# Patient Record
Sex: Male | Born: 1950 | Race: Black or African American | Hispanic: No | Marital: Married | Smoking: Never smoker
Health system: Southern US, Community
[De-identification: ages and names within clinical notes are randomized; demographics above are authoritative.]

## PROBLEM LIST (undated history)

## (undated) DIAGNOSIS — F419 Anxiety disorder, unspecified: Secondary | ICD-10-CM

## (undated) DIAGNOSIS — T7840XA Allergy, unspecified, initial encounter: Secondary | ICD-10-CM

## (undated) DIAGNOSIS — D649 Anemia, unspecified: Secondary | ICD-10-CM

## (undated) DIAGNOSIS — E119 Type 2 diabetes mellitus without complications: Secondary | ICD-10-CM

## (undated) DIAGNOSIS — K219 Gastro-esophageal reflux disease without esophagitis: Secondary | ICD-10-CM

## (undated) DIAGNOSIS — G709 Myoneural disorder, unspecified: Secondary | ICD-10-CM

## (undated) DIAGNOSIS — IMO0002 Reserved for concepts with insufficient information to code with codable children: Secondary | ICD-10-CM

## (undated) HISTORY — DX: Type 2 diabetes mellitus without complications: E11.9

## (undated) HISTORY — DX: Anxiety disorder, unspecified: F41.9

## (undated) HISTORY — DX: Gastro-esophageal reflux disease without esophagitis: K21.9

## (undated) HISTORY — DX: Allergy, unspecified, initial encounter: T78.40XA

## (undated) HISTORY — PX: COLONOSCOPY: SHX174

## (undated) HISTORY — DX: Reserved for concepts with insufficient information to code with codable children: IMO0002

## (undated) HISTORY — DX: Anemia, unspecified: D64.9

## (undated) HISTORY — DX: Myoneural disorder, unspecified: G70.9

---

## 1998-12-25 ENCOUNTER — Emergency Department (HOSPITAL_COMMUNITY): Admission: EM | Admit: 1998-12-25 | Discharge: 1998-12-25 | Payer: Self-pay | Admitting: Emergency Medicine

## 2010-04-25 ENCOUNTER — Encounter: Admission: RE | Admit: 2010-04-25 | Discharge: 2010-04-25 | Payer: Self-pay | Admitting: *Deleted

## 2014-07-24 ENCOUNTER — Other Ambulatory Visit: Payer: Self-pay | Admitting: Family Medicine

## 2014-07-24 DIAGNOSIS — R17 Unspecified jaundice: Secondary | ICD-10-CM

## 2014-07-24 DIAGNOSIS — R945 Abnormal results of liver function studies: Secondary | ICD-10-CM

## 2014-07-24 DIAGNOSIS — R7989 Other specified abnormal findings of blood chemistry: Secondary | ICD-10-CM

## 2014-07-28 ENCOUNTER — Ambulatory Visit
Admission: RE | Admit: 2014-07-28 | Discharge: 2014-07-28 | Disposition: A | Discharge status: Home / Self Care | Payer: Medicare Other | Source: Ambulatory Visit | Attending: Family Medicine | Admitting: Family Medicine

## 2014-07-28 DIAGNOSIS — R17 Unspecified jaundice: Secondary | ICD-10-CM

## 2014-07-28 DIAGNOSIS — R945 Abnormal results of liver function studies: Secondary | ICD-10-CM

## 2014-07-28 DIAGNOSIS — R7989 Other specified abnormal findings of blood chemistry: Secondary | ICD-10-CM

## 2015-04-23 ENCOUNTER — Encounter (HOSPITAL_COMMUNITY): Payer: Self-pay | Admitting: *Deleted

## 2015-04-23 ENCOUNTER — Other Ambulatory Visit: Payer: Self-pay

## 2015-04-23 ENCOUNTER — Emergency Department (HOSPITAL_COMMUNITY)
Admission: EM | Admit: 2015-04-23 | Discharge: 2015-04-24 | Disposition: A | Discharge status: Home / Self Care | Payer: Medicare Other | Attending: Emergency Medicine | Admitting: Emergency Medicine

## 2015-04-23 DIAGNOSIS — Z791 Long term (current) use of non-steroidal anti-inflammatories (NSAID): Secondary | ICD-10-CM | POA: Insufficient documentation

## 2015-04-23 DIAGNOSIS — R002 Palpitations: Secondary | ICD-10-CM

## 2015-04-23 DIAGNOSIS — Z87828 Personal history of other (healed) physical injury and trauma: Secondary | ICD-10-CM | POA: Insufficient documentation

## 2015-04-23 LAB — CBC
HCT: 34.8 % — ABNORMAL LOW (ref 39.0–52.0)
HEMOGLOBIN: 11.7 g/dL — AB (ref 13.0–17.0)
MCH: 25.3 pg — AB (ref 26.0–34.0)
MCHC: 33.6 g/dL (ref 30.0–36.0)
MCV: 75.3 fL — AB (ref 78.0–100.0)
PLATELETS: 270 10*3/uL (ref 150–400)
RBC: 4.62 MIL/uL (ref 4.22–5.81)
RDW: 15.1 % (ref 11.5–15.5)
WBC: 6.9 10*3/uL (ref 4.0–10.5)

## 2015-04-23 LAB — I-STAT TROPONIN, ED: TROPONIN I, POC: 0 ng/mL (ref 0.00–0.08)

## 2015-04-23 LAB — BASIC METABOLIC PANEL
Anion gap: 6 (ref 5–15)
BUN: 13 mg/dL (ref 6–20)
CALCIUM: 8.9 mg/dL (ref 8.9–10.3)
CO2: 26 mmol/L (ref 22–32)
CREATININE: 1.03 mg/dL (ref 0.61–1.24)
Chloride: 106 mmol/L (ref 101–111)
Glucose, Bld: 131 mg/dL — ABNORMAL HIGH (ref 65–99)
Potassium: 4.9 mmol/L (ref 3.5–5.1)
Sodium: 138 mmol/L (ref 135–145)

## 2015-04-23 NOTE — ED Notes (Signed)
Pt reports palpitations shortly after taking his Mobic today around 1615. Pt states the palpations feel "lighter" then when they initially started. Pt denies CP, shortness of breath.

## 2015-04-23 NOTE — ED Notes (Signed)
Pt c/o palpitations starting today after taking Mobic; denies shortness of breath, diaphoresis, or NV. Pt is taking Mobic for a muscle injury from working out. Reports 15 minutes after taking medication, felt palpitations. Pt is physically active and bradycardia at baseline. Pt denies palpitations at present.

## 2015-04-24 NOTE — Discharge Instructions (Signed)
Cardiac Event Monitoring A cardiac event monitor is a small recording device used to help detect abnormal heart rhythms (arrhythmias). The monitor is used to record heart rhythm when noticeable symptoms such as the following occur:  Fast heartbeats (palpitations), such as heart racing or fluttering.  Dizziness.  Fainting or light-headedness.  Unexplained weakness. The monitor is wired to two electrodes placed on your chest. Electrodes are flat, sticky disks that attach to your skin. The monitor can be worn for up to 30 days. You will wear the monitor at all times, except when bathing.  HOW TO USE YOUR CARDIAC EVENT MONITOR A technician will prepare your chest for the electrode placement. The technician will show you how to place the electrodes, how to work the monitor, and how to replace the batteries. Take time to practice using the monitor before you leave the office. Make sure you understand how to send the information from the monitor to your health care provider. This requires a telephone with a landline, not a cell phone. You need to:  Wear your monitor at all times, except when you are in water:  Do not get the monitor wet.  Take the monitor off when bathing. Do not swim or use a hot tub with it on.  Keep your skin clean. Do not put body lotion or moisturizer on your chest.  Change the electrodes daily or any time they stop sticking to your skin. You might need to use tape to keep them on.  It is possible that your skin under the electrodes could become irritated. To keep this from happening, try to put the electrodes in slightly different places on your chest. However, they must remain in the area under your left breast and in the upper right section of your chest.  Make sure the monitor is safely clipped to your clothing or in a location close to your body that your health care provider recommends.  Press the button to record when you feel symptoms of heart trouble, such as  dizziness, weakness, light-headedness, palpitations, thumping, shortness of breath, unexplained weakness, or a fluttering or racing heart. The monitor is always on and records what happened slightly before you pressed the button, so do not worry about being too late to get good information.  Keep a diary of your activities, such as walking, doing chores, and taking medicine. It is especially important to note what you were doing when you pushed the button to record your symptoms. This will help your health care provider determine what might be contributing to your symptoms. The information stored in your monitor will be reviewed by your health care provider alongside your diary entries.  Send the recorded information as recommended by your health care provider. It is important to understand that it will take some time for your health care provider to process the results.  Change the batteries as recommended by your health care provider. SEEK IMMEDIATE MEDICAL CARE IF:   You have chest pain.  You have extreme difficulty breathing or shortness of breath.  You develop a very fast heartbeat that persists.  You develop dizziness that does not go away.  You faint or constantly feel you are about to faint. Document Released: 07/25/2008 Document Revised: 03/02/2014 Document Reviewed: 04/14/2013 [facility] Patient Information 2015 [place], [place]. This information is not intended to replace advice given to you by your health care provider. Make sure you discuss any questions you have with your health care provider. Palpitations A palpitation is the feeling  that your heartbeat is irregular or is faster than normal. It may feel like your heart is fluttering or skipping a beat. Palpitations are usually not a serious problem. However, in some cases, you may need further medical evaluation. CAUSES  Palpitations can be caused by:  Smoking.  Caffeine or other stimulants, such as diet pills or energy  drinks.  Alcohol.  Stress and anxiety.  Strenuous physical activity.  Fatigue.  Certain medicines.  Heart disease, especially if you have a history of irregular heart rhythms (arrhythmias), such as atrial fibrillation, atrial flutter, or supraventricular tachycardia.  An improperly working pacemaker or defibrillator. DIAGNOSIS  To find the cause of your palpitations, your health care provider will take your medical history and perform a physical exam. Your health care provider may also have you take a test called an ambulatory electrocardiogram (ECG). An ECG records your heartbeat patterns over a 24-hour period. You may also have other tests, such as:  Transthoracic echocardiogram (TTE). During echocardiography, sound waves are used to evaluate how blood flows through your heart.  Transesophageal echocardiogram (TEE).  Cardiac monitoring. This allows your health care provider to monitor your heart rate and rhythm in real time.  Holter monitor. This is a portable device that records your heartbeat and can help diagnose heart arrhythmias. It allows your health care provider to track your heart activity for several days, if needed.  Stress tests by exercise or by giving medicine that makes the heart beat faster. TREATMENT  Treatment of palpitations depends on the cause of your symptoms and can vary greatly. Most cases of palpitations do not require any treatment other than time, relaxation, and monitoring your symptoms. Other causes, such as atrial fibrillation, atrial flutter, or supraventricular tachycardia, usually require further treatment. HOME CARE INSTRUCTIONS   Avoid:  Caffeinated coffee, tea, soft drinks, diet pills, and energy drinks.  Chocolate.  Alcohol.  Stop smoking if you smoke.  Reduce your stress and anxiety. Things that can help you relax include:  A method of controlling things in your body, such as your heartbeats, with your mind  (biofeedback).  Yoga.  Meditation.  Physical activity such as swimming, jogging, or walking.  Get plenty of rest and sleep. SEEK MEDICAL CARE IF:   You continue to have a fast or irregular heartbeat beyond 24 hours.  Your palpitations occur more often. SEEK IMMEDIATE MEDICAL CARE IF:  You have chest pain or shortness of breath.  You have a severe headache.  You feel dizzy or you faint. MAKE SURE YOU:  Understand these instructions.  Will watch your condition.  Will get help right away if you are not doing well or get worse. Document Released: 10/13/2000 Document Revised: 10/21/2013 Document Reviewed: 12/15/2011 [facility] Patient Information 2015 [place], [place]. This information is not intended to replace advice given to you by your health care provider. Make sure you discuss any questions you have with your health care provider.

## 2015-04-24 NOTE — ED Provider Notes (Signed)
CSN: [id number]     Arrival date & time 04/23/15  1951 History   First MD Initiated Contact with Patient 04/23/15 2309     Chief Complaint  Patient presents with  . Palpitations     (Consider location/radiation/quality/duration/timing/severity/associated sxs/prior Treatment) HPI Comments: Dustin Marshall is a 64 y/o male with no significant PMH who presents to the ED with a one day history of palpitations.  States that Dustin Marshall was prescribed meloxicam yesterday by his orthopedist for a shoulder injury from working out; took meloxicam today at 4:15, and 15 minutes later began experiencing palpitations, prompting patient to come to ED.  States heart was not beating fast and had no chest pain, but could feel his heart "beating loudly and forcefully" in his chest.  Palpitations lasted approx. 1 hour and have since subsided.  Patient is a very physically active runner and has never experienced palpitations before; states Dustin Marshall wanted to make sure "nothing dangerous" was going on. Denies any other drug use, fever, sweats, chills, nausea, vomiting, or pain radiation.  Patient is a 64 y.o. male presenting with palpitations. The history is provided by the patient.  Palpitations   History reviewed. No pertinent past medical history. History reviewed. No pertinent past surgical history. History reviewed. No pertinent family history. History  Substance Use Topics  . Smoking status: Never Smoker   . Smokeless tobacco: Never Used  . Alcohol Use: Yes    Review of Systems  Cardiovascular: Positive for palpitations.  All other systems reviewed and are negative.     Allergies  Mobic  Home Medications   Prior to Admission medications   Medication Sig Start Date End Date Taking? Authorizing Provider  famotidine (PEPCID) 20 MG tablet Take 20 mg by mouth once.   Yes Historical Provider, MD  fluticasone (FLONASE) 50 MCG/ACT nasal spray Place 2 sprays into both nostrils daily as needed for allergies.   Yes  Historical Provider, MD  meloxicam (MOBIC) 15 MG tablet Take 15 mg by mouth daily.    Historical Provider, MD   BP 104/74 mmHg  Pulse 46  Temp(Src) 97.9 F (36.6 C) (Oral)  Resp 14  SpO2 100% Physical Exam  Constitutional: Dustin Marshall is oriented to person, place, and time. Dustin Marshall appears well-developed.  HENT:  Head: Normocephalic and atraumatic.  Eyes: Conjunctivae and EOM are normal. Pupils are equal, round, and reactive to light.  Neck: Normal range of motion. Neck supple.  Cardiovascular: Normal rate, regular rhythm and normal heart sounds.   Pulmonary/Chest: Effort normal and breath sounds normal. No respiratory distress. Dustin Marshall has no wheezes.  Abdominal: Soft. Bowel sounds are normal. Dustin Marshall exhibits no distension. There is no tenderness. There is no rebound and no guarding.  Neurological: Dustin Marshall is alert and oriented to person, place, and time.  Skin: Skin is warm.  Nursing note and vitals reviewed.   ED Course  Procedures (including critical care time) Labs Review Labs Reviewed  CBC - Abnormal; Notable for the following:    Hemoglobin 11.7 (*)    HCT 34.8 (*)    MCV 75.3 (*)    MCH 25.3 (*)    All other components within normal limits  BASIC METABOLIC PANEL - Abnormal; Notable for the following:    Glucose, Bld 131 (*)    All other components within normal limits  I-STAT TROPOININ, ED    Imaging Review No results found.   EKG Interpretation   Date/Time:  Friday April 23 2015 20:06:30 EDT Ventricular Rate:  48 PR Interval:  128 QRS Duration: 88 QT Interval:  436 QTC Calculation: 389 R Axis:   75 Text Interpretation:  Sinus bradycardia Otherwise normal ECG ED PHYSICIAN  INTERPRETATION AVAILABLE IN [facility] Confirmed by TEST, Record  ([id number]) on 04/24/2015 7:34:36 AM      MDM   Final diagnoses:  Palpitations    Pt comes in with palpitations that lasted for an hour. Pt was asymptomatic, and has no palpitations currently. Advised pcp f/u.     Varney Biles,  MD 04/24/15 [phone]

## 2015-06-16 IMAGING — US US ABDOMEN COMPLETE
1 series · 14 of 25 positions shown · non-contrast
Comparison: None.

CLINICAL DATA: 63-year-old male with abnormal liver function
studies and elevated bilirubin. Initial encounter.

EXAM:
ULTRASOUND ABDOMEN COMPLETE

[Series 1: us abdomen complete · 0.30mm/px · 14 of 79 slices shown]
[im 1/79]
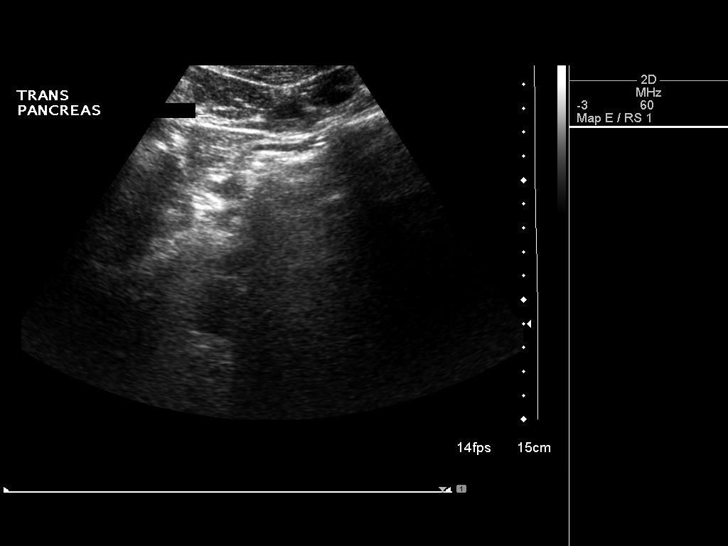
[im 7/79]
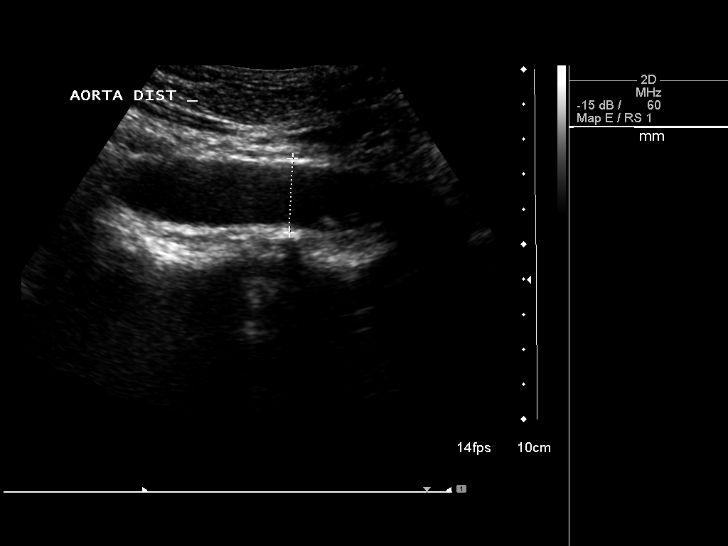
[im 14/79]
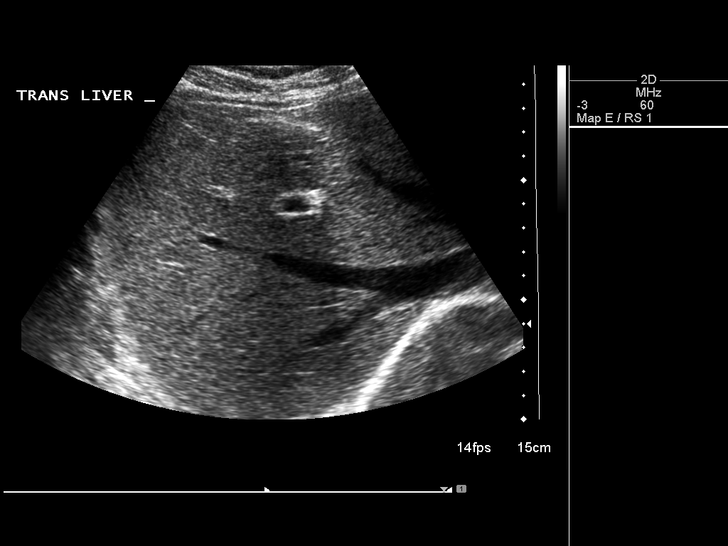
[im 20/79]
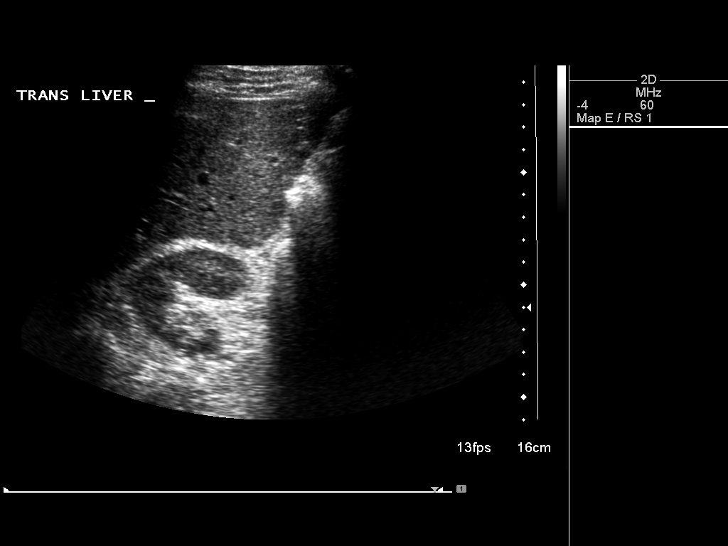
[im 27/79]
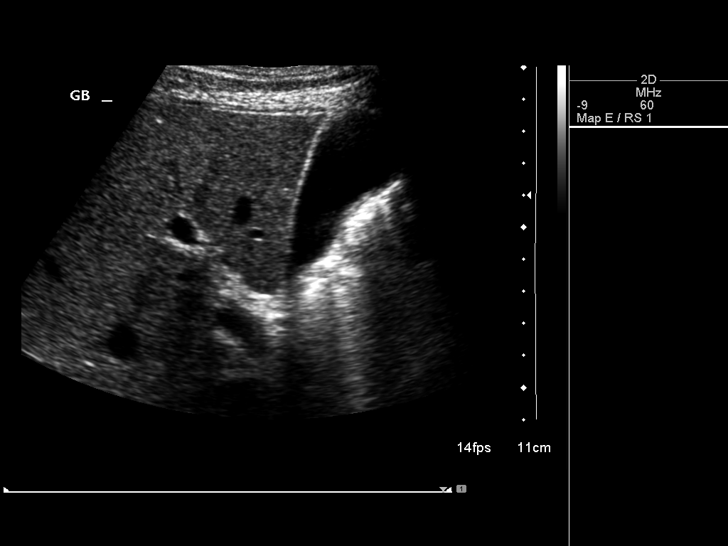
[im 30/79]
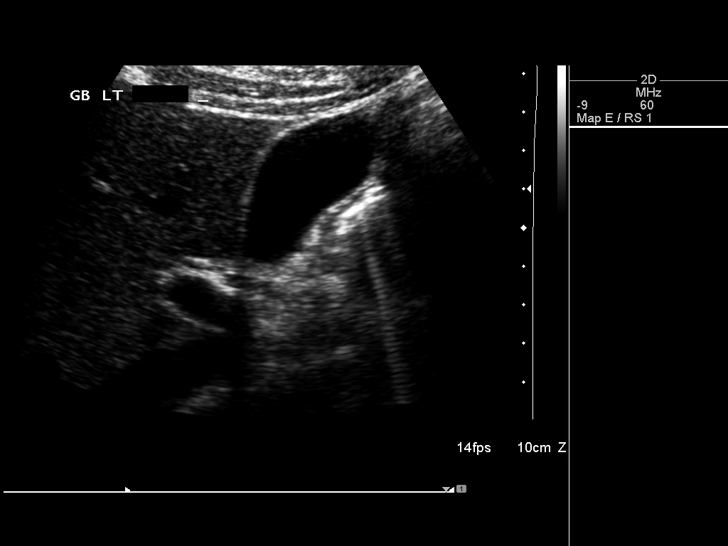
[im 36/79]
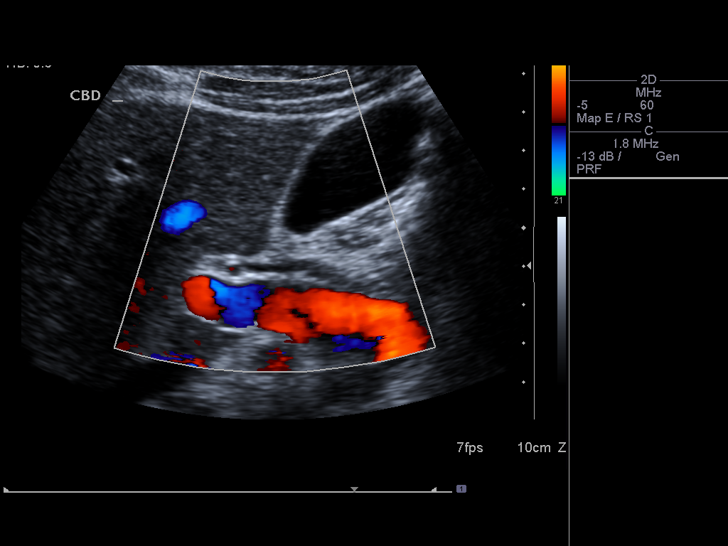
[im 43/79]
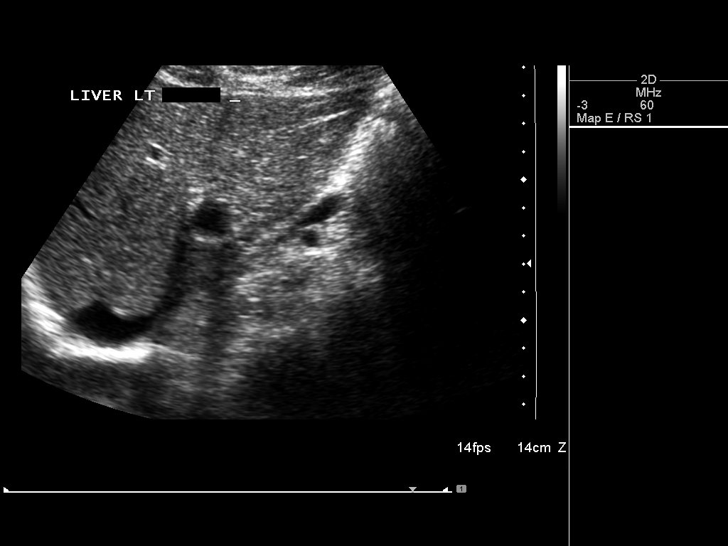
[im 49/79]
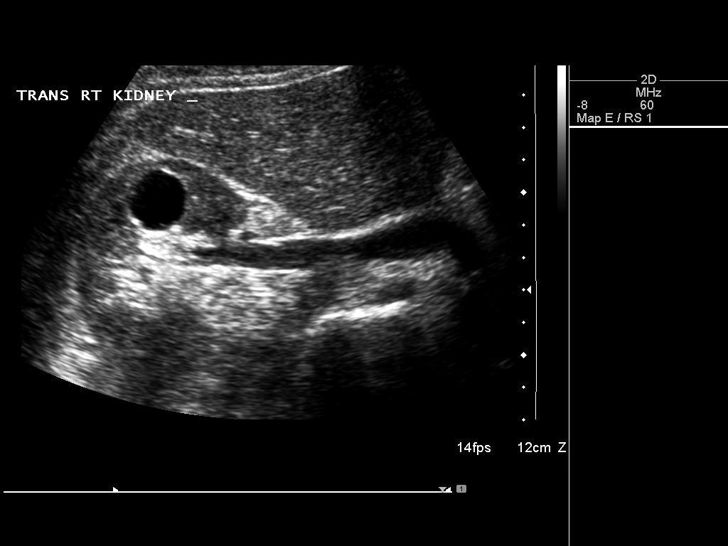
[im 53/79]
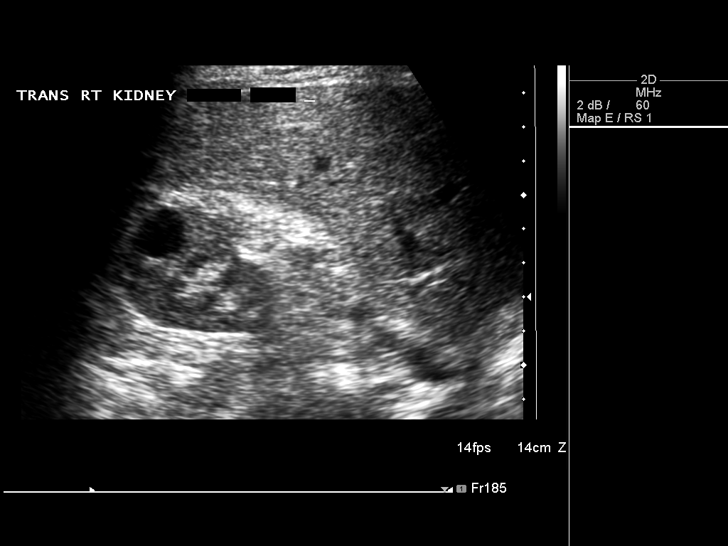
[im 59/79]
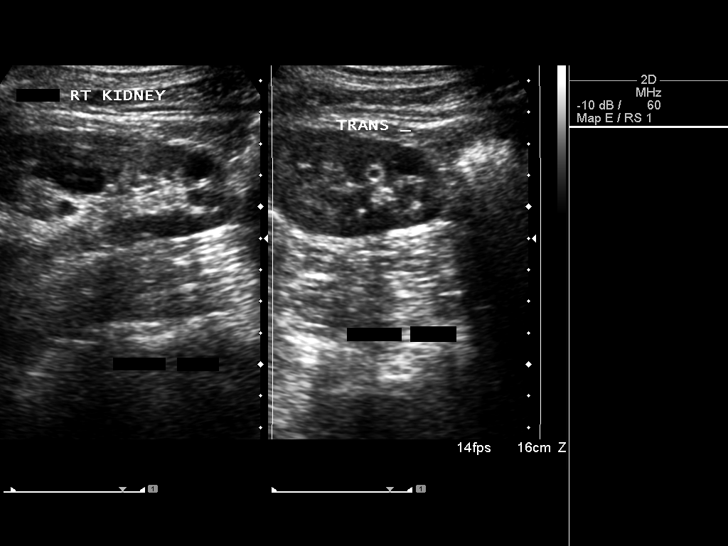
[im 66/79]
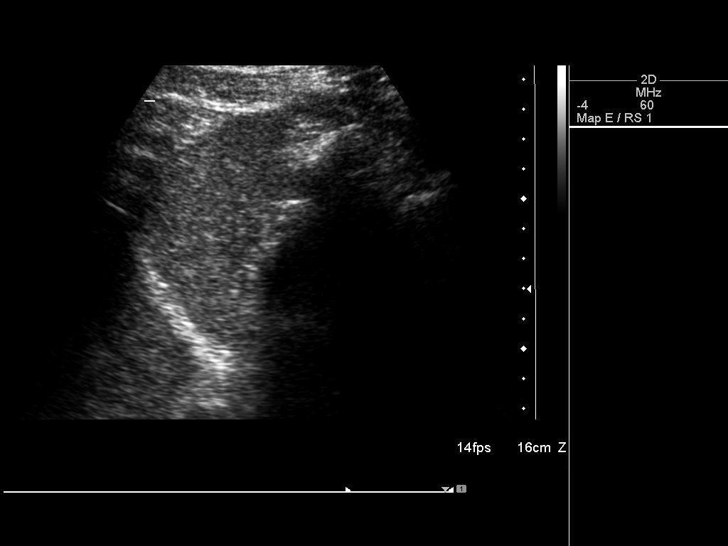
[im 72/79]
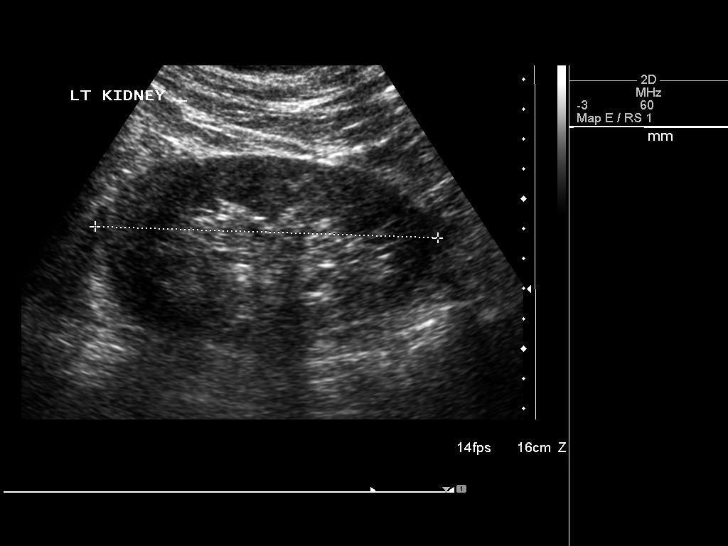
[im 79/79]
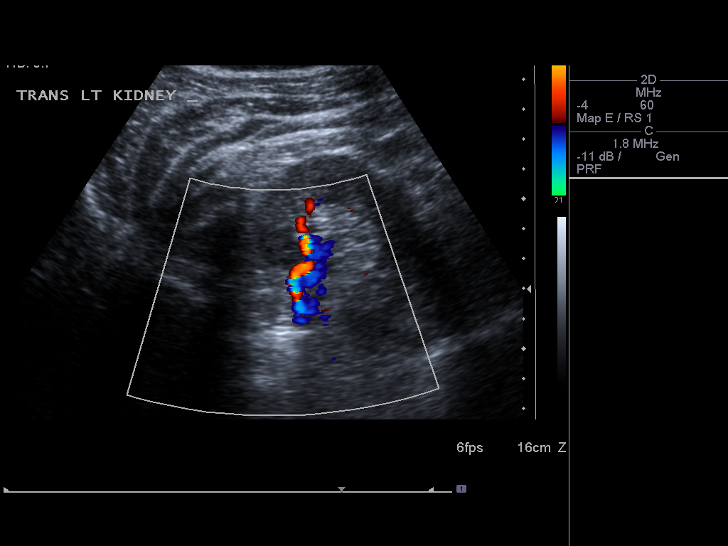

[14 of 25 positions shown; findings below may reference images not displayed]

FINDINGS: Gallbladder:

No gallstones or wall thickening visualized. No sonographic Murphy
sign noted.

Common bile duct:

Diameter: 2.5 mm. Distal aspect not visualized secondary to bowel
gas.

Liver:

No focal lesion identified. Within normal limits in parenchymal
echogenicity.

IVC:

No abnormality visualized.

Pancreas:

Not visualized secondary to bowel gas.

Spleen:

Size and appearance within normal limits.

Right Kidney:

Length: 13.0 cm.. No hydronephrosis. Three renal cysts measuring up
to 1.7 cm, 1.9 cm and less than 1 cm.

Left Kidney:

Length: 11.5 cm.. Echogenicity within normal limits. No mass or
hydronephrosis visualized.

Abdominal aorta:

No aneurysm visualized.

Other findings:

None.
IMPRESSION: Pancreas and distal common bile duct not visualized secondary to
bowel gas.

Three right renal cysts largest measuring up to 1.9 cm.

Otherwise negative exam as noted.

## 2015-12-21 ENCOUNTER — Telehealth: Payer: Self-pay | Admitting: Hematology and Oncology

## 2015-12-21 NOTE — Telephone Encounter (Signed)
Called pt left vm in ref to np appt. °

## 2015-12-22 ENCOUNTER — Telehealth: Payer: Self-pay | Admitting: Hematology and Oncology

## 2015-12-22 NOTE — Telephone Encounter (Signed)
Pt is aware of np appt on 12/28/15@1 :30 Referring Dr. Penelope Coop Dx-Thalassemia

## 2015-12-28 ENCOUNTER — Ambulatory Visit (HOSPITAL_BASED_OUTPATIENT_CLINIC_OR_DEPARTMENT_OTHER): Payer: Medicare Other | Admitting: Hematology and Oncology

## 2015-12-28 ENCOUNTER — Encounter: Payer: Self-pay | Admitting: Hematology and Oncology

## 2015-12-28 VITALS — BP 145/78 | HR 43 | Temp 97.5°F | Resp 19 | Ht 74.0 in | Wt 191.0 lb

## 2015-12-28 DIAGNOSIS — D509 Iron deficiency anemia, unspecified: Secondary | ICD-10-CM | POA: Diagnosis not present

## 2015-12-28 NOTE — Progress Notes (Signed)
[facility] CONSULT NOTE  Patient Care Team: Aura Dials, MD as PCP - General (Family Medicine) Wonda Horner, MD as Consulting Physician (Gastroenterology)  CHIEF COMPLAINTS/PURPOSE OF CONSULTATION:  Chronic microcytic anemia, suspect thalassemia  HISTORY OF PRESENTING ILLNESS:  Dustin Marshall 65 y.o. male is here because of chronic microcytic anemia with possible diagnosis of thalassemia. The patient printed a copy of his blood work dated all the way to July 2005. Even when he was younger, he was told that he had been anemic He had CBC measurement with hemoglobin ranging from 12.2-13.3 with low MCV ranging from 76.9-7 9.6. The RBC count were within normal limits from 4.8-5.2. He never required blood transfusions. He never complained of fatigue, chest pain or shortness of breath Multiple family members including his niece and his sister were diagnosed with thalassemia.  MEDICAL HISTORY:  Past Medical History  Diagnosis Date  . Allergy   . GERD (gastroesophageal reflux disease)     SURGICAL HISTORY: Past Surgical History  Procedure Laterality Date  . Colonoscopy      SOCIAL HISTORY: Social History   Social History  . Marital Status: Single    Spouse Name: N/A  . Number of Children: N/A  . Years of Education: N/A   Occupational History  . Not on file.   Social History Main Topics  . Smoking status: Never Smoker   . Smokeless tobacco: Never Used  . Alcohol Use: Yes  . Drug Use: No  . 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   Other Topics Concern  . Not on file   Social History Narrative    FAMILY HISTORY: Family History  Problem Relation Age of Onset  . Thalassemia Sister   . Thalassemia Other   . Cancer Father     lung ca  . Cancer Brother     lung ca    ALLERGIES:  is allergic to mobic.  MEDICATIONS:  Current Outpatient Prescriptions  Medication Sig Dispense Refill   . famotidine (PEPCID) 20 MG tablet Take 20 mg by mouth once.    . fluticasone (FLONASE) 50 MCG/ACT nasal spray Place 2 sprays into both nostrils daily as needed for allergies.     No current facility-administered medications for this visit.    REVIEW OF SYSTEMS:   Constitutional: Denies fevers, chills or abnormal night sweats Eyes: Denies blurriness of vision, double vision or watery eyes Ears, nose, mouth, throat, and face: Denies mucositis or sore throat Respiratory: Denies cough, dyspnea or wheezes Cardiovascular: Denies palpitation, chest discomfort or lower extremity swelling Gastrointestinal:  Denies nausea, heartburn or change in bowel habits Skin: Denies abnormal skin rashes Lymphatics: Denies new lymphadenopathy or easy bruising Neurological:Denies numbness, tingling or new weaknesses Behavioral/Psych: Mood is stable, no new changes  All other systems were reviewed with the patient and are negative.  PHYSICAL EXAMINATION: ECOG PERFORMANCE STATUS: 0 - Asymptomatic  Filed Vitals:   12/28/15 1346  BP: 145/78  Pulse: 43  Temp: 97.5 F (36.4 C)  Resp: 19   Filed Weights   12/28/15 1346  Weight: 191 lb (86.637 kg)    GENERAL:alert, no distress and comfortable SKIN: skin color, texture, turgor are normal, no rashes or significant lesions EYES: normal, conjunctiva are pink and non-injected, sclera clear OROPHARYNX:no exudate, no erythema and lips, buccal mucosa, and tongue normal  NECK: supple, thyroid normal size, non-tender, without nodularity LYMPH:  no palpable lymphadenopathy  in the cervical, axillary or inguinal LUNGS: clear to auscultation and percussion with normal breathing effort HEART: regular rate & rhythm and no murmurs and no lower extremity edema ABDOMEN:abdomen soft, non-tender and normal bowel sounds. No palpable splenomegaly Musculoskeletal:no cyanosis of digits and no clubbing  PSYCH: alert & oriented x 3 with fluent speech NEURO: no focal  motor/sensory deficits  LABORATORY DATA:  I have reviewed the data as listed Lab Results  Component Value Date   WBC 6.9 04/23/2015   HGB 11.7* 04/23/2015   HCT 34.8* 04/23/2015   MCV 75.3* 04/23/2015   PLT 270 04/23/2015    Recent Labs  04/23/15 2024  NA 138  K 4.9  CL 106  CO2 26  GLUCOSE 131*  BUN 13  CREATININE 1.03  CALCIUM 8.9  GFRNONAA >60  GFRAA >60   ASSESSMENT & PLAN:   Microcytic anemia On review of his blood work dated back the last 12 years, 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 We discussed the risk and benefit of ordering hemoglobin electrophoresis to confirm the diagnosis. The patient has been feeling well with no symptoms of anemia whatsoever. 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 I provided patient extensive education about what it means. It does not cause reduced life span and it has no negative consequences to his health or longevity I recommend the patient to educate other family members as this is an inheritable disorder. At the end of today's discussion, we are in agreement not to pursue further workup. I have not made return appointment for the patient to come back.    All questions were answered. The patient knows to call the clinic with any problems, questions or concerns. I spent 30 minutes counseling the patient face to face. The total time spent in the appointment was 40 minutes and more than 50% was on counseling.     [facility], [place], MD 12/28/2015 2:23 PM

## 2015-12-28 NOTE — Assessment & Plan Note (Addendum)
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 We discussed the risk and benefit of ordering hemoglobin electrophoresis to confirm the diagnosis. The patient has been feeling well with no symptoms of anemia whatsoever. 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 I provided patient extensive education about what it means. It does not cause reduced life span and it has no negative consequences to his health or longevity I recommend the patient to educate other family members as this is an inheritable disorder. At the end of today's discussion, we are in agreement not to pursue further workup. I have not made return appointment for the patient to come back.

## 2017-01-15 ENCOUNTER — Encounter: Payer: Self-pay | Admitting: Neurology

## 2017-01-15 ENCOUNTER — Ambulatory Visit (INDEPENDENT_AMBULATORY_CARE_PROVIDER_SITE_OTHER): Payer: Medicare Other | Admitting: Neurology

## 2017-01-15 VITALS — BP 116/74 | HR 46 | Resp 20 | Ht 73.5 in | Wt 172.0 lb

## 2017-01-15 DIAGNOSIS — R519 Headache, unspecified: Secondary | ICD-10-CM

## 2017-01-15 DIAGNOSIS — K148 Other diseases of tongue: Secondary | ICD-10-CM

## 2017-01-15 DIAGNOSIS — H93A9 Pulsatile tinnitus, unspecified ear: Secondary | ICD-10-CM

## 2017-01-15 DIAGNOSIS — R51 Headache: Secondary | ICD-10-CM | POA: Diagnosis not present

## 2017-01-15 DIAGNOSIS — I671 Cerebral aneurysm, nonruptured: Secondary | ICD-10-CM

## 2017-01-15 DIAGNOSIS — K1379 Other lesions of oral mucosa: Secondary | ICD-10-CM

## 2017-01-15 DIAGNOSIS — H539 Unspecified visual disturbance: Secondary | ICD-10-CM

## 2017-01-15 NOTE — Progress Notes (Signed)
[facility]    Provider:  Dr Jaynee Eagles Referring Provider: Aura Dials, MD Primary Care Physician:  Cammy Copa, MD  CC:  headache  HPI:  Dustin Marshall is a 66 y.o. male here as a referral from Dr. Sheryn Bison for headache. Past medical history sciatica, allergic rhinitis, allergies. Mother died of a cerebral hemorrhage and sister was found recently to have 2 aneurysms in her brain. He has never smoked. Drinks alcohol 2+ per week. He is retired. Headache started in November. No inciting events or head trauma. He just noticed one day, nothing special and not intnse. At first they were stronger than now but they come 1-2x a day and 1-2x a night. They last 5-15 minutes. 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 At night it is worse when laying down. In the crown of the head and further back a little, sometime in the past had eye pain but recently more of an ache. Not burning or tingling. No light or sound sensitivity. No exposure to toxins. No changes in vision, he may need to have his glasses change. He has pulsatile tinnitus. A tylenol helps but not taking tylenol daily. No triggers. On average 3-4/10. His vision is worse today on exam that it was a month ago at the doctors office. No hx of headaches. Mo FHx of migraines. He snores but has lost 35 lbs. No morning or nocturnal headaches. No stress. He is retired and stress is better. Hi vision is 20/70 today which is worse than when he went to optometrist a month ago. No other focal neurologic deficits, associated symptoms, inciting events or modifiable factors.  Reviewed notes, labs and imaging from outside physicians, which showed:  Review primary care notes. Patient was last seen in early January. For daily headaches since November 2017. Headaches are mild to moderate in intensity and generally located over his frontal area on top of his head and described as persistent aching  pain. He also began to develop pain behind his right eye. No change in his vision. He saw an eye doctor last year. No precipitating causes for his headaches but they do tend to be worse at night. They do not appear to be positional. His had no pain in his neck or nasal congestion. He's had no nausea or vomiting. He does feel little bit dizzy on occasion when he goes from a lying to a sitting position. No trauma to his head. His mother died of a cerebral hemorrhage and his sister was found recently to have 2 aneurysms in her brain. Tongue and palate deviate to the right. Right frontal disc slightly indistinct compared to left.   Review of Systems: Patient complains of symptoms per HPI as well as the following symptoms: dizziness, allergies, runny nose. Pertinent negatives per HPI. All others negative.   Social History   Social History  . Marital status: Single    Spouse name: N/A  . Number of children: N/A  . Years of education: N/A   Occupational History  . Not on file.   Social History Main Topics  . Smoking status: Never Smoker  . Smokeless tobacco: Never Used  . Alcohol use Yes  . Drug use: No  . 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   Other Topics Concern  . Not on file   Social History Narrative  . No narrative  on file    Family History  Problem Relation Age of Onset  . Thalassemia Other   . Cancer Father     lung ca  . Cancer Brother     lung ca  . Thalassemia Sister   . Aneurysm Sister   . Aneurysm Mother     Past Medical History:  Diagnosis Date  . Allergy   . GERD (gastroesophageal reflux disease)     Past Surgical History:  Procedure Laterality Date  . COLONOSCOPY      Current Outpatient Prescriptions  Medication Sig Dispense Refill  . fluticasone (FLONASE) 50 MCG/ACT nasal spray Place 2 sprays into both nostrils daily as needed for allergies.     No current facility-administered  medications for this visit.     Allergies as of 01/15/2017 - Review Complete 01/15/2017  Allergen Reaction Noted  . Mobic [meloxicam] Palpitations 04/24/2015    Vitals: BP 116/74   Pulse (!) 46   Resp 20   Ht 6' 1.5" (1.867 m)   Wt 172 lb (78 kg)   BMI 22.38 kg/m  Last Weight:  Wt Readings from Last 1 Encounters:  01/15/17 172 lb (78 kg)   Last Height:   Ht Readings from Last 1 Encounters:  01/15/17 6' 1.5" (1.867 m)   Physical exam: Exam: Gen: NAD, conversant, well nourised, well groomed                     CV: RRR, no MRG. No Carotid Bruits. No peripheral edema, warm, nontender Eyes: Conjunctivae clear without exudates or hemorrhage  Neuro: Detailed Neurologic Exam  Speech:    Speech is normal; fluent and spontaneous with normal comprehension.  Cognition:    The patient is oriented to person, place, and time;     recent and remote memory intact;     language fluent;     normal attention, concentration,     fund of knowledge Cranial Nerves:     R 20/50, left 20/70, OU 20/70 corrected. The pupils are equal, round, and reactive to light. The fundi are normal and spontaneous venous pulsations are present. Visual fields are full to finger confrontation. Extraocular movements are intact. Trigeminal sensation is intact and the muscles of mastication are normal. The face is symmetric. tongue and uvula deviate to the right. Hearing intact. Voice is normal. Shoulder shrug is normal. The tongue without fasciculations.   Coordination:    Normal finger to nose and heel to shin. Normal rapid alternating movements.   Gait:    Heel-toe and tandem gait are normal.   Motor Observation:    No asymmetry, no atrophy, and no involuntary movements noted. Tone:    Normal muscle tone.    Posture:    Posture is normal. normal erect    Strength:    Strength is V/V in the upper and lower limbs.      Sensation: intact to LT     Reflex Exam:  DTR's:    Deep tendon reflexes in  the uppers are brisk and in lower extremities are trace bilaterally.   Toes:    The toes are downgoing bilaterally.   Clonus:    Clonus is absent.       Assessment/Plan:  66 year old male with new onset daily headaches after the age of 73 for 5 months. Need MRI of the brain to evaluate for any lesions or space occupying masses especially given his tongue and uvula deviation and worsening vision. We'll check labs today for  temporal arteritis but suspicion is very low for this. Also given his pulsatile tinnitus, family history of cerebral aneurysms in mother and sister need to evaluate MRA of the head for aneurysms.  MRI brain/MRA head  Discussed: To prevent or relieve headaches, try the following: Cool Compress. Lie down and place a cool compress on your head.  Avoid headache triggers. If certain foods or odors seem to have triggered your migraines in the past, avoid them. A headache diary might help you identify triggers.  Include physical activity in your daily routine. Try a daily walk or other moderate aerobic exercise.  Manage stress. Find healthy ways to cope with the stressors, such as delegating tasks on your to-do list.  Practice relaxation techniques. Try deep breathing, yoga, massage and visualization.  Eat regularly. Eating regularly scheduled meals and maintaining a healthy diet might help prevent headaches. Also, drink plenty of fluids.  Follow a regular sleep schedule. Sleep deprivation might contribute to headaches Consider biofeedback. With this mind-body technique, you learn to control certain bodily functions - such as muscle tension, heart rate and blood pressure - to prevent headaches or reduce headache pain.    Proceed to emergency room if you experience new or worsening symptoms or symptoms do not resolve, if you have new neurologic symptoms or if headache is severe, or for any concerning symptom.   Cc: Dr. Rowe Robert, [facility] [address] [place], [place] [address]  Phone [phone] Fax [phone]

## 2017-01-15 NOTE — Patient Instructions (Signed)
Remember to drink plenty of fluid, eat healthy meals and do not skip any meals. Try to eat protein with a every meal and eat a healthy snack such as fruit or nuts in between meals. Try to keep a regular sleep-wake schedule and try to exercise daily, particularly in the form of walking, 20-30 minutes a day, if you can.   As far as your medications are concerned, I would like to suggest: Tylenol as needed. No more than 3-4x a week.  As far as diagnostic testing: MRI brain and MRA head  I would like to see you back in 4-6 weeks, sooner if we need to. Please call us with any interim questions, concerns, problems, updates or refill requests.   Our phone number is [phone]. We also have an after hours call service for urgent matters and there is a physician on-call for urgent questions. For any emergencies you know to call 911 or go to the nearest emergency room   Tension Headache A tension headache is a feeling of pain, pressure, or aching that is often felt over the front and sides of the head. The pain can be dull, or it can feel tight (constricting). Tension headaches are not normally associated with nausea or vomiting, and they do not get worse with physical activity. Tension headaches can last from 30 minutes to several days. This is the most common type of headache. CAUSES The exact cause of this condition is not known. Tension headaches often begin after stress, anxiety, or depression. Other triggers may include:  Alcohol.  Too much caffeine, or caffeine withdrawal.  Respiratory infections, such as colds, flu, or sinus infections.  Dental problems or teeth clenching.  Fatigue.  Holding your head and neck in the same position for a long period of time, such as while using a computer.  Smoking. SYMPTOMS Symptoms of this condition include:  A feeling of pressure around the head.  Dull, aching head pain.  Pain felt over the front and sides of the head.  Tenderness in the  muscles of the head, neck, and shoulders. DIAGNOSIS This condition may be diagnosed based on your symptoms and a physical exam. Tests may be done, such as a CT scan or an MRI of your head. These tests may be done if your symptoms are severe or unusual. TREATMENT This condition may be treated with lifestyle changes and medicines to help relieve symptoms. HOME CARE INSTRUCTIONS Managing Pain   Take over-the-counter and prescription medicines only as told by your health care provider.  Lie down in a dark, quiet room when you have a headache.  If directed, apply ice to the head and neck area:  Put ice in a plastic bag.  Place a towel between your skin and the bag.  Leave the ice on for 20 minutes, 2-3 times per day.  Use a heating pad or a hot shower to apply heat to the head and neck area as told by your health care provider. Eating and Drinking   Eat meals on a regular schedule.  Limit alcohol use.  Decrease your caffeine intake, or stop using caffeine. General Instructions   Keep all follow-up visits as told by your health care provider. This is important.  Keep a headache journal to help find out what may trigger your headaches. For example, write down:  What you eat and drink.  How much sleep you get.  Any change to your diet or medicines.  Try massage or other relaxation techniques.  Limit  stress.  Sit up straight, and avoid tensing your muscles.  Do not use tobacco products, including cigarettes, chewing tobacco, or e-cigarettes. If you need help quitting, ask your health care provider.  Exercise regularly as told by your health care provider.  Get 7-9 hours of sleep, or the amount recommended by your health care provider. SEEK MEDICAL CARE IF:  Your symptoms are not helped by medicine.  You have a headache that is different from what you normally experience.  You have nausea or you vomit.  You have a fever. SEEK IMMEDIATE MEDICAL CARE IF:  Your  headache becomes severe.  You have repeated vomiting.  You have a stiff neck.  You have a loss of vision.  You have problems with speech.  You have pain in your eye or ear.  You have muscular weakness or loss of muscle control.  You lose your balance or you have trouble walking.  You feel faint or you pass out.  You have confusion. This information is not intended to replace advice given to you by your health care provider. Make sure you discuss any questions you have with your health care provider. Document Released: 10/16/2005 Document Revised: 07/07/2015 Document Reviewed: 02/08/2015 Elsevier Interactive Patient Education  2017 [employer].

## 2017-01-16 LAB — BASIC METABOLIC PANEL
BUN / CREAT RATIO: 18 (ref 10–24)
BUN: 21 mg/dL (ref 8–27)
CO2: 21 mmol/L (ref 18–29)
CREATININE: 1.15 mg/dL (ref 0.76–1.27)
Calcium: 9.8 mg/dL (ref 8.6–10.2)
Chloride: 101 mmol/L (ref 96–106)
GFR calc Af Amer: 76 mL/min/{1.73_m2} (ref >59)
GFR, EST NON AFRICAN AMERICAN: 65 mL/min/{1.73_m2} (ref >59)
Glucose: 91 mg/dL (ref 65–99)
POTASSIUM: 4.8 mmol/L (ref 3.5–5.2)
SODIUM: 140 mmol/L (ref 134–144)

## 2017-01-16 LAB — SEDIMENTATION RATE: SED RATE: 32 mm/h — AB (ref 0–30)

## 2017-01-16 LAB — C-REACTIVE PROTEIN: CRP: 0.7 mg/L (ref 0.0–4.9)

## 2017-01-17 ENCOUNTER — Telehealth: Payer: Self-pay | Admitting: *Deleted

## 2017-01-17 NOTE — Telephone Encounter (Signed)
Per Dr Jaynee Eagles, spoke with patient and informed him his lab results are unremarkable. He verbalized understanding., had no questions.

## 2017-01-27 ENCOUNTER — Ambulatory Visit
Admission: RE | Admit: 2017-01-27 | Discharge: 2017-01-27 | Disposition: A | Discharge status: Home / Self Care | Payer: Medicare Other | Source: Ambulatory Visit | Attending: Neurology | Admitting: Neurology

## 2017-01-27 DIAGNOSIS — H93A9 Pulsatile tinnitus, unspecified ear: Secondary | ICD-10-CM

## 2017-01-27 DIAGNOSIS — K1379 Other lesions of oral mucosa: Secondary | ICD-10-CM

## 2017-01-27 DIAGNOSIS — H539 Unspecified visual disturbance: Secondary | ICD-10-CM | POA: Diagnosis not present

## 2017-01-27 DIAGNOSIS — K148 Other diseases of tongue: Secondary | ICD-10-CM

## 2017-01-27 DIAGNOSIS — R519 Headache, unspecified: Secondary | ICD-10-CM

## 2017-01-27 DIAGNOSIS — R51 Headache: Principal | ICD-10-CM

## 2017-01-27 DIAGNOSIS — I671 Cerebral aneurysm, nonruptured: Secondary | ICD-10-CM

## 2017-01-27 MED ORDER — GADOBENATE DIMEGLUMINE 529 MG/ML IV SOLN
16.0000 mL | Freq: Once | INTRAVENOUS | Status: AC | PRN
  Administered 2017-01-27: 16 mL via INTRAVENOUS

## 2017-01-31 ENCOUNTER — Telehealth: Payer: Self-pay

## 2017-01-31 NOTE — Telephone Encounter (Signed)
-----   Message from Melvenia Beam, MD sent at 01/31/2017 11:12 AM EDT ----- MRI normal for age

## 2017-01-31 NOTE — Telephone Encounter (Signed)
-----   Message from Melvenia Beam, MD sent at 01/31/2017 11:13 AM EDT ----- MRA normal

## 2017-01-31 NOTE — Telephone Encounter (Signed)
Called pt w/ MRI/MRA results. May call back w/ any questions/concerns.

## 2017-02-20 ENCOUNTER — Encounter (INDEPENDENT_AMBULATORY_CARE_PROVIDER_SITE_OTHER): Payer: Self-pay

## 2017-02-20 ENCOUNTER — Ambulatory Visit (INDEPENDENT_AMBULATORY_CARE_PROVIDER_SITE_OTHER): Payer: Medicare Other | Admitting: Neurology

## 2017-02-20 ENCOUNTER — Encounter: Payer: Self-pay | Admitting: Neurology

## 2017-02-20 VITALS — BP 124/76 | HR 44 | Ht 73.5 in | Wt 175.2 lb

## 2017-02-20 DIAGNOSIS — R51 Headache: Secondary | ICD-10-CM | POA: Diagnosis not present

## 2017-02-20 DIAGNOSIS — R519 Headache, unspecified: Secondary | ICD-10-CM

## 2017-02-20 NOTE — Progress Notes (Signed)
[facility]    Provider:  Dr Jaynee Eagles Referring Provider: Aura Dials, MD Primary Care Physician:  Cammy Copa, MD  CC:  headache  Interval history:  66 year old male with new onset daily headaches after the age of 18. MRI of the brain and MRA of the head were unremarkable. He returns today to discuss. Esr and crp were unremarkable as well. He is feeling better. Reviewed MRI and MRA images with patient explaining pertinent findings. His brain is very lovely in his blood vessels are widely patent. He exercises and watches his weight. He is compliant with medications. At this point we'll not prescribe medications for his headache. Did discuss headache triggers such as dehydration, certain foods to watch. Recommended keeping a headache journal. His headaches have improved.  HPI:  Dustin Marshall is a 66 y.o. male here as a referral from Dr. Sheryn Bison for headache. Past medical history sciatica, allergic rhinitis, allergies. Mother died of a cerebral hemorrhage and sister was found recently to have 2 aneurysms in her brain. He has never smoked. Drinks alcohol 2+ per week. He is retired. Headache started in November. No inciting events or head trauma. He just noticed one day, nothing special and not intnse. At first they were stronger than now but they come 1-2x a day and 1-2x a night. They last 5-15 minutes. 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 At night it is worse when laying down. In the crown of the head and further back a little, sometime in the past had eye pain but recently more of an ache. Not burning or tingling. No light or sound sensitivity. No exposure to toxins. No changes in vision, he may need to have his glasses change. He has pulsatile tinnitus. A tylenol helps but not taking tylenol daily. No triggers. On average 3-4/10. His vision is worse today on exam that it was a month ago at the doctors office. No hx of  headaches. Mo FHx of migraines. He snores but has lost 35 lbs. No morning or nocturnal headaches. No stress. He is retired and stress is better. Hi vision is 20/70 today which is worse than when he went to optometrist a month ago. No other focal neurologic deficits, associated symptoms, inciting events or modifiable factors.  Reviewed notes, labs and imaging from outside physicians, which showed:  Review primary care notes. Patient was last seen in early January. For daily headaches since November 2017. Headaches are mild to moderate in intensity and generally located over his frontal area on top of his head and described as persistent aching pain. He also began to develop pain behind his right eye. No change in his vision. He saw an eye doctor last year. No precipitating causes for his headaches but they do tend to be worse at night. They do not appear to be positional. His had no pain in his neck or nasal congestion. He's had no nausea or vomiting. He does feel little bit dizzy on occasion when he goes from a lying to a sitting position. No trauma to his head. His mother died of a cerebral hemorrhage and his sister was found recently to have 2 aneurysms in her brain. Tongue and palate deviate to the right. Right frontal disc slightly indistinct compared to left.   Review of Systems: Patient complains of symptoms per HPI as well as the following symptoms: dizziness, allergies, runny nose. Pertinent negatives per HPI. All others negative.  Marland Kitchen  Social History   Social History  . Marital status: Single    Spouse name: N/A  . Number of children: N/A  . Years of education: N/A   Occupational History  . Not on file.   Social History Main Topics  . Smoking status: Never Smoker  . Smokeless tobacco: Never Used  . Alcohol use Yes  . Drug use: No  . 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   Other Topics Concern  . Not on  file   Social History Narrative  . No narrative on file    Family History  Problem Relation Age of Onset  . Thalassemia Other   . Cancer Father     lung ca  . Cancer Brother     lung ca  . Thalassemia Sister   . Aneurysm Sister   . Aneurysm Mother     Past Medical History:  Diagnosis Date  . Allergy   . GERD (gastroesophageal reflux disease)     Past Surgical History:  Procedure Laterality Date  . COLONOSCOPY      Current Outpatient Prescriptions  Medication Sig Dispense Refill  . fluticasone (FLONASE) 50 MCG/ACT nasal spray Place 2 sprays into both nostrils daily as needed for allergies.     No current facility-administered medications for this visit.     Allergies as of 02/20/2017 - Review Complete 01/15/2017  Allergen Reaction Noted  . Mobic [meloxicam] Palpitations 04/24/2015    Vitals: There were no vitals taken for this visit. Last Weight:  Wt Readings from Last 1 Encounters:  01/15/17 172 lb (78 kg)   Last Height:   Ht Readings from Last 1 Encounters:  01/15/17 6' 1.5" (1.867 m)     Physical exam: Exam: Gen: NAD, conversant, well nourised, well groomed                     CV: RRR, no MRG. No Carotid Bruits. No peripheral edema, warm, nontender Eyes: Conjunctivae clear without exudates or hemorrhage  Neuro: Detailed Neurologic Exam  Speech:    Speech is normal; fluent and spontaneous with normal comprehension.  Cognition:    The patient is oriented to person, place, and time;     recent and remote memory intact;     language fluent;     normal attention, concentration,     fund of knowledge Cranial Nerves:     R 20/50, left 20/70, OU 20/70 corrected. The pupils are equal, round, and reactive to light. The fundi are normal and spontaneous venous pulsations are present. Visual fields are full to finger confrontation. Extraocular movements are intact. Trigeminal sensation is intact and the muscles of mastication are normal. The face is  symmetric. tongue and uvula deviate to the right. Hearing intact. Voice is normal. Shoulder shrug is normal. The tongue without fasciculations.   Coordination:    Normal finger to nose and heel to shin. Normal rapid alternating movements.   Gait:    Heel-toe and tandem gait are normal.   Motor Observation:    No asymmetry, no atrophy, and no involuntary movements noted. Tone:    Normal muscle tone.    Posture:    Posture is normal. normal erect    Strength:    Strength is V/V in the upper and lower limbs.      Sensation: intact to LT     Reflex Exam:  DTR's:  Deep tendon reflexes in the uppers are brisk and in lower extremities are trace bilaterally.   Toes:    The toes are downgoing bilaterally.   Clonus:    Clonus is absent.       Assessment/Plan:  66 year old male with new onset daily headaches after the age of 19 for 5 months. Need MRI of the brain to evaluate for any lesions or space occupying masses especially given his tongue and uvula deviation and worsening vision. We'll check labs today for temporal arteritis but suspicion is very low for this. Also given his pulsatile tinnitus, family history of cerebral aneurysms in mother and sister need to evaluate MRA of the head for aneurysms.  MRI brain/MRA head were unremarkable  Discussed: To prevent or relieve headaches, try the following:  Cool Compress. Lie down and place a cool compress on your head.   Avoid headache triggers. If certain foods or odors seem to have triggered your migraines in the past, avoid them. A headache diary might help you identify triggers.   Include physical activity in your daily routine. Try a daily walk or other moderate aerobic exercise.   Manage stress. Find healthy ways to cope with the stressors, such as delegating tasks on your to-do list.   Practice relaxation techniques. Try deep breathing, yoga, massage and visualization.   Eat regularly. Eating regularly  scheduled meals and maintaining a healthy diet might help prevent headaches. Also, drink plenty of fluids.   Follow a regular sleep schedule. Sleep deprivation might contribute to headaches  Consider biofeedback. With this mind-body technique, you learn to control certain bodily functions - such as muscle tension, heart rate and blood pressure - to prevent headaches or reduce headache pain.    Proceed to emergency room if you experience new or worsening symptoms or symptoms do not resolve, if you have new neurologic symptoms or if headache is severe, or for any concerning symptom.   Cc: Dr. Rowe Robert, MD  [facility] [address] [place], [place] [address]  Phone [phone] Fax [phone]  A total of 25 minutes was spent face-to-face with this patient. Over half this time was spent on counseling patient on the daily headaches diagnosis and different diagnostic and therapeutic options available.

## 2018-08-28 ENCOUNTER — Telehealth: Payer: Self-pay

## 2018-08-28 NOTE — Telephone Encounter (Signed)
He called and left a message requesting appt on the answering service yesterday. Called and left message to call the office regarding appt.

## 2018-08-29 ENCOUNTER — Telehealth: Payer: Self-pay

## 2018-08-29 ENCOUNTER — Telehealth: Payer: Self-pay | Admitting: Hematology and Oncology

## 2018-08-29 NOTE — Telephone Encounter (Signed)
Scheduled appt per 10/31 sch message - pt is aware of appt date and time

## 2018-08-29 NOTE — Telephone Encounter (Signed)
He called and left a message to call him. Called back. He is requesting appt with Dr. Alvy Bimler to follow up on  appt in 2017. Shedding message sent.

## 2018-10-10 ENCOUNTER — Encounter: Payer: Self-pay | Admitting: Hematology and Oncology

## 2018-10-10 ENCOUNTER — Telehealth: Payer: Self-pay | Admitting: Hematology and Oncology

## 2018-10-10 ENCOUNTER — Inpatient Hospital Stay: Payer: Medicare Other | Attending: Hematology and Oncology | Admitting: Hematology and Oncology

## 2018-10-10 DIAGNOSIS — R51 Headache: Secondary | ICD-10-CM | POA: Diagnosis not present

## 2018-10-10 DIAGNOSIS — D509 Iron deficiency anemia, unspecified: Secondary | ICD-10-CM | POA: Diagnosis not present

## 2018-10-10 DIAGNOSIS — G8929 Other chronic pain: Secondary | ICD-10-CM | POA: Insufficient documentation

## 2018-10-10 DIAGNOSIS — E559 Vitamin D deficiency, unspecified: Secondary | ICD-10-CM | POA: Diagnosis not present

## 2018-10-10 DIAGNOSIS — R519 Headache, unspecified: Secondary | ICD-10-CM

## 2018-10-10 NOTE — Telephone Encounter (Signed)
Per 12/12 los, return for no new orders.

## 2018-10-10 NOTE — Assessment & Plan Note (Signed)
He has intermittent vitamin D deficiency He has some mild chronic skeletal pain.  I recommend vitamin D supplement 2000 units daily

## 2018-10-10 NOTE — Progress Notes (Signed)
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 We discussed the implication of genetic testing  Vitamin D deficiency He has intermittent vitamin D deficiency He has some mild chronic skeletal pain.  I recommend vitamin D supplement 2000 units daily  Chronic headaches He has chronic headaches and follows with neurologist I do not believe the abnormal CBC is the cause of his headaches I would defer to them for further management    No orders of the defined types were placed in this encounter.   INTERVAL HISTORY: Dustin Marshall 67 y.o. male returns for further follow-up for return visit He brought with him copies of blood test dated back to 2005  He requested this visit because he is not sure whether his recent chronic headaches and musculoskeletal pain is related to his abnormal CBC  SUMMARY OF HEMATOLOGIC HISTORY:  Dustin Marshall was seen here because of chronic microcytic anemia with possible diagnosis of thalassemia. The patient printed a copy of his blood work dated all the way to July 2005. Even when he was younger, he was told that he had been anemic He had CBC measurement with hemoglobin ranging from 12.2-13.3 with low MCV ranging from 76.9-7 9.6. The RBC count were within normal limits from 4.8-5.2. He never required blood transfusions. He never complained of fatigue, chest pain or shortness of breath Multiple family members including his niece and his sister were diagnosed with thalassemia.  I have reviewed the past medical history, past surgical  history, social history and family history with the patient and they are unchanged from previous note.  ALLERGIES:  is allergic to mobic [meloxicam].  MEDICATIONS:  Current Outpatient Medications  Medication Sig Dispense Refill  . acetaminophen (TYLENOL) 325 MG tablet Take 650 mg by mouth every 6 (six) hours as needed.    . fluticasone (FLONASE) 50 MCG/ACT nasal spray Place 2 sprays into both nostrils daily as needed for allergies.     No current facility-administered medications for this visit.      REVIEW OF SYSTEMS:   Constitutional: Denies fevers, chills or night sweats Eyes: Denies blurriness of vision Ears, nose, mouth, throat, and face: Denies mucositis or sore throat Respiratory: Denies cough, dyspnea or wheezes Cardiovascular: Denies palpitation, chest discomfort or lower extremity swelling Gastrointestinal:  Denies nausea, heartburn or change in bowel habits Skin: Denies abnormal skin rashes Lymphatics: Denies new lymphadenopathy or easy bruising Neurological:Denies numbness, tingling or new weaknesses Behavioral/Psych: Mood is stable, no new changes  All other systems were reviewed with the patient and are negative.  PHYSICAL EXAMINATION: ECOG PERFORMANCE STATUS: 0 - Asymptomatic  Vitals:   10/10/18 0936  BP: 120/71  Pulse: (!) 50  Resp: 18  Temp: (!) 97.5 F (36.4 C)  SpO2: 100%   Filed Weights   10/10/18 0936  Weight: 186 lb 3.2 oz (84.5 kg)    GENERAL:alert, no distress and comfortable NEURO: alert & oriented x 3 with fluent speech, no focal motor/sensory deficits  LABORATORY DATA:  I have reviewed the data as listed     Component Value Date/Time   NA 140 01/15/2017 4944  K 4.8 01/15/2017 0808   CL 101 01/15/2017 0808   CO2 21 01/15/2017 0808   GLUCOSE 91 01/15/2017 0808   GLUCOSE 131 (H) 04/23/2015 2024   BUN 21 01/15/2017 0808   CREATININE 1.15 01/15/2017 0808   CALCIUM 9.8 01/15/2017 0808   GFRNONAA 65 01/15/2017 0808   GFRAA 76  01/15/2017 0808    No results found for: SPEP, UPEP  Lab Results  Component Value Date   WBC 6.9 04/23/2015   HGB 11.7 (L) 04/23/2015   HCT 34.8 (L) 04/23/2015   MCV 75.3 (L) 04/23/2015   PLT 270 04/23/2015      Chemistry      Component Value Date/Time   NA 140 01/15/2017 0808   K 4.8 01/15/2017 0808   CL 101 01/15/2017 0808   CO2 21 01/15/2017 0808   BUN 21 01/15/2017 0808   CREATININE 1.15 01/15/2017 0808      Component Value Date/Time   CALCIUM 9.8 01/15/2017 0808     I spent 15 minutes counseling the patient face to face. The total time spent in the appointment was 20 minutes and more than 50% was on counseling.   All questions were answered. The patient knows to call the clinic with any problems, questions or concerns. No barriers to learning was detected.    Heath Lark, MD 12/12/201912:28 PM

## 2018-10-10 NOTE — Assessment & Plan Note (Signed)
He has chronic headaches and follows with neurologist I do not believe the abnormal CBC is the cause of his headaches I would defer to them for further management

## 2018-10-10 NOTE — Assessment & Plan Note (Addendum)
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 We discussed the implication of genetic testing

## 2020-01-14 DIAGNOSIS — D649 Anemia, unspecified: Secondary | ICD-10-CM | POA: Diagnosis not present

## 2020-01-14 DIAGNOSIS — E559 Vitamin D deficiency, unspecified: Secondary | ICD-10-CM | POA: Diagnosis not present

## 2020-01-14 DIAGNOSIS — R972 Elevated prostate specific antigen [PSA]: Secondary | ICD-10-CM | POA: Diagnosis not present

## 2020-01-14 DIAGNOSIS — E782 Mixed hyperlipidemia: Secondary | ICD-10-CM | POA: Diagnosis not present

## 2020-07-06 DIAGNOSIS — E782 Mixed hyperlipidemia: Secondary | ICD-10-CM | POA: Diagnosis not present

## 2020-07-06 DIAGNOSIS — Z0000 Encounter for general adult medical examination without abnormal findings: Secondary | ICD-10-CM | POA: Diagnosis not present

## 2020-07-06 DIAGNOSIS — R739 Hyperglycemia, unspecified: Secondary | ICD-10-CM | POA: Diagnosis not present

## 2020-07-06 DIAGNOSIS — D6489 Other specified anemias: Secondary | ICD-10-CM | POA: Diagnosis not present

## 2020-07-06 DIAGNOSIS — R972 Elevated prostate specific antigen [PSA]: Secondary | ICD-10-CM | POA: Diagnosis not present

## 2020-07-06 DIAGNOSIS — E559 Vitamin D deficiency, unspecified: Secondary | ICD-10-CM | POA: Diagnosis not present

## 2020-07-06 DIAGNOSIS — D563 Thalassemia minor: Secondary | ICD-10-CM | POA: Diagnosis not present

## 2020-07-12 DIAGNOSIS — Z0000 Encounter for general adult medical examination without abnormal findings: Secondary | ICD-10-CM | POA: Diagnosis not present

## 2020-12-03 DIAGNOSIS — Z20822 Contact with and (suspected) exposure to covid-19: Secondary | ICD-10-CM | POA: Diagnosis not present

## 2021-07-15 DIAGNOSIS — J301 Allergic rhinitis due to pollen: Secondary | ICD-10-CM | POA: Diagnosis not present

## 2021-07-15 DIAGNOSIS — R739 Hyperglycemia, unspecified: Secondary | ICD-10-CM | POA: Diagnosis not present

## 2021-07-15 DIAGNOSIS — Z23 Encounter for immunization: Secondary | ICD-10-CM | POA: Diagnosis not present

## 2021-07-15 DIAGNOSIS — Z0000 Encounter for general adult medical examination without abnormal findings: Secondary | ICD-10-CM | POA: Diagnosis not present

## 2021-07-15 DIAGNOSIS — E559 Vitamin D deficiency, unspecified: Secondary | ICD-10-CM | POA: Diagnosis not present

## 2021-07-15 DIAGNOSIS — E782 Mixed hyperlipidemia: Secondary | ICD-10-CM | POA: Diagnosis not present

## 2021-07-15 DIAGNOSIS — D649 Anemia, unspecified: Secondary | ICD-10-CM | POA: Diagnosis not present

## 2021-09-29 DIAGNOSIS — Z0000 Encounter for general adult medical examination without abnormal findings: Secondary | ICD-10-CM | POA: Diagnosis not present

## 2022-06-02 DIAGNOSIS — R739 Hyperglycemia, unspecified: Secondary | ICD-10-CM | POA: Diagnosis not present

## 2022-06-02 DIAGNOSIS — Z1211 Encounter for screening for malignant neoplasm of colon: Secondary | ICD-10-CM | POA: Diagnosis not present

## 2022-06-02 DIAGNOSIS — G603 Idiopathic progressive neuropathy: Secondary | ICD-10-CM | POA: Diagnosis not present

## 2022-06-02 DIAGNOSIS — R7309 Other abnormal glucose: Secondary | ICD-10-CM | POA: Diagnosis not present

## 2022-06-02 DIAGNOSIS — D649 Anemia, unspecified: Secondary | ICD-10-CM | POA: Diagnosis not present

## 2022-06-02 DIAGNOSIS — E559 Vitamin D deficiency, unspecified: Secondary | ICD-10-CM | POA: Diagnosis not present

## 2022-06-06 DIAGNOSIS — K7689 Other specified diseases of liver: Secondary | ICD-10-CM | POA: Diagnosis not present

## 2022-06-08 ENCOUNTER — Other Ambulatory Visit (HOSPITAL_BASED_OUTPATIENT_CLINIC_OR_DEPARTMENT_OTHER): Payer: Self-pay | Admitting: Family Medicine

## 2022-06-08 DIAGNOSIS — R945 Abnormal results of liver function studies: Secondary | ICD-10-CM

## 2022-06-12 ENCOUNTER — Ambulatory Visit (HOSPITAL_BASED_OUTPATIENT_CLINIC_OR_DEPARTMENT_OTHER)
Admission: RE | Admit: 2022-06-12 | Discharge: 2022-06-12 | Disposition: A | Discharge status: Home / Self Care | Payer: Medicare PPO | Source: Ambulatory Visit | Attending: Family Medicine | Admitting: Family Medicine

## 2022-06-12 ENCOUNTER — Encounter (HOSPITAL_BASED_OUTPATIENT_CLINIC_OR_DEPARTMENT_OTHER): Payer: Self-pay

## 2022-06-12 DIAGNOSIS — R945 Abnormal results of liver function studies: Secondary | ICD-10-CM | POA: Insufficient documentation

## 2022-06-12 DIAGNOSIS — N281 Cyst of kidney, acquired: Secondary | ICD-10-CM | POA: Diagnosis not present

## 2022-06-26 DIAGNOSIS — G629 Polyneuropathy, unspecified: Secondary | ICD-10-CM | POA: Diagnosis not present

## 2022-07-25 DIAGNOSIS — R972 Elevated prostate specific antigen [PSA]: Secondary | ICD-10-CM | POA: Diagnosis not present

## 2022-07-25 DIAGNOSIS — G629 Polyneuropathy, unspecified: Secondary | ICD-10-CM | POA: Diagnosis not present

## 2022-07-25 DIAGNOSIS — Z1211 Encounter for screening for malignant neoplasm of colon: Secondary | ICD-10-CM | POA: Diagnosis not present

## 2022-07-25 DIAGNOSIS — E782 Mixed hyperlipidemia: Secondary | ICD-10-CM | POA: Diagnosis not present

## 2022-07-25 DIAGNOSIS — Z0000 Encounter for general adult medical examination without abnormal findings: Secondary | ICD-10-CM | POA: Diagnosis not present

## 2022-07-25 DIAGNOSIS — Z23 Encounter for immunization: Secondary | ICD-10-CM | POA: Diagnosis not present

## 2022-07-25 DIAGNOSIS — R799 Abnormal finding of blood chemistry, unspecified: Secondary | ICD-10-CM | POA: Diagnosis not present

## 2022-07-25 DIAGNOSIS — L821 Other seborrheic keratosis: Secondary | ICD-10-CM | POA: Diagnosis not present

## 2022-08-22 DIAGNOSIS — R739 Hyperglycemia, unspecified: Secondary | ICD-10-CM | POA: Diagnosis not present

## 2022-08-22 DIAGNOSIS — G629 Polyneuropathy, unspecified: Secondary | ICD-10-CM | POA: Diagnosis not present

## 2022-10-18 ENCOUNTER — Encounter: Payer: Self-pay | Admitting: Neurology

## 2022-10-18 ENCOUNTER — Ambulatory Visit: Payer: Medicare PPO | Admitting: Neurology

## 2022-10-18 VITALS — BP 136/74 | HR 48 | Ht 74.0 in | Wt 172.6 lb

## 2022-10-18 DIAGNOSIS — G608 Other hereditary and idiopathic neuropathies: Secondary | ICD-10-CM | POA: Diagnosis not present

## 2022-10-18 DIAGNOSIS — G6289 Other specified polyneuropathies: Secondary | ICD-10-CM | POA: Diagnosis not present

## 2022-10-18 NOTE — Patient Instructions (Addendum)
-May consider daily alpha lipoic acid which is an antioxidant that may reduce free radical oxidative stress associated with diabetic polyneuropathy, existing evidence suggests that alpha lipoic acid significantly reduces stabbing, lancinating and burning pain and diabetic neuropathy with its onset of action as early as 1-2 weeks. 400-'600mg'$  a day.   - Recommend treatment for diabetes since I believe you are symptomatic with Dr. Sheryn Bison possibly Metformin or per Dr. March Rummage recommendations  - Also recommend continuing the B12 daily  - Test you for a few more labs.   Peripheral Neuropathy Peripheral neuropathy is a type of nerve damage. It affects nerves that carry signals between the spinal cord and the arms, legs, and the rest of the body (peripheral nerves). It does not affect nerves in the spinal cord or brain. In peripheral neuropathy, one nerve or a group of nerves may be damaged. Peripheral neuropathy is a broad category that includes many specific nerve disorders, like diabetic neuropathy, hereditary neuropathy, and carpal tunnel syndrome. What are the causes? This condition may be caused by: Certain diseases, such as: Diabetes. This is the most common cause of peripheral neuropathy. Autoimmune diseases, such as rheumatoid arthritis and systemic lupus erythematosus. Nerve diseases that are passed from parent to child (inherited). Kidney disease. Thyroid disease. Other causes may include: Nerve injury. Pressure or stress on a nerve that lasts a long time. Lack (deficiency) of B vitamins. This can result from alcoholism, poor diet, or a restricted diet. Infections. Some medicines, such as cancer medicines (chemotherapy). Poisonous (toxic) substances, such as lead and mercury. Too little blood flowing to the legs. In some cases, the cause of this condition is not known. What are the signs or symptoms? Symptoms of this condition depend on which of your nerves is damaged. Symptoms in  the legs, hands, and arms can include: Loss of feeling (numbness) in the feet, hands, or both. Tingling in the feet, hands, or both. Burning pain. Very sensitive skin. Weakness. Not being able to move a part of the body (paralysis). Clumsiness or poor coordination. Muscle twitching. Loss of balance. Symptoms in other parts of the body can include: Not being able to control your bladder. Feeling dizzy. Sexual problems. How is this diagnosed? Diagnosing and finding the cause of peripheral neuropathy can be difficult. Your health care provider will take your medical history and do a physical exam. A neurological exam will also be done. This involves checking things that are affected by your brain, spinal cord, and nerves (nervous system). For example, your health care provider will check your reflexes, how you move, and what you can feel. You may have other tests, such as: Blood tests. Electromyogram (EMG) and nerve conduction tests. These tests check nerve function and how well the nerves are controlling the muscles. Imaging tests, such as a CT scan or MRI, to rule out other causes of your symptoms. Removing a small piece of nerve to be examined in a lab (nerve biopsy). Removing and examining a small amount of the fluid that surrounds the brain and spinal cord (lumbar puncture). How is this treated? Treatment for this condition may involve: Treating the underlying cause of the neuropathy, such as diabetes, kidney disease, or vitamin deficiencies. Stopping medicines that can cause neuropathy, such as chemotherapy. Medicine to help relieve pain. Medicines may include: Prescription or over-the-counter pain medicine. Anti-seizure medicine. Antidepressants. Pain-relieving patches that are applied to painful areas of skin. Surgery to relieve pressure on a nerve or to destroy a nerve that is causing pain.  Physical therapy to help improve movement and balance. Devices to help you move around  (assistive devices). Follow these instructions at home: Medicines Take over-the-counter and prescription medicines only as told by your health care provider. Do not take any other medicines without first asking your health care provider. Ask your health care provider if the medicine prescribed to you requires you to avoid driving or using machinery. Lifestyle  Do not use any products that contain nicotine or tobacco. These products include cigarettes, chewing tobacco, and vaping devices, such as e-cigarettes. Smoking keeps blood from reaching damaged nerves. If you need help quitting, ask your health care provider. Avoid or limit alcohol. Too much alcohol can cause a vitamin B deficiency, and vitamin B is needed for healthy nerves. Eat a healthy diet. This includes: Eating foods that are high in fiber, such as beans, whole grains, and fresh fruits and vegetables. Limiting foods that are high in fat and processed sugars, such as fried or sweet foods. General instructions  If you have diabetes, work closely with your health care provider to keep your blood sugar under control. If you have numbness in your feet: Check every day for signs of injury or infection. Watch for redness, warmth, and swelling. Wear padded socks and comfortable shoes. These help protect your feet. Develop a good support system. Living with peripheral neuropathy can be stressful. Consider talking with a mental health specialist or joining a support group. Use assistive devices and attend physical therapy as told by your health care provider. This may include using a walker or a cane. Keep all follow-up visits. This is important. Where to find more information [employer] of Neurological Disorders: [employer] Contact a health care provider if: You have new signs or symptoms of peripheral neuropathy. You are struggling emotionally from dealing with peripheral neuropathy. Your pain is not well controlled. Get  help right away if: You have an injury or infection that is not healing normally. You develop new weakness in an arm or leg. You have fallen or do so frequently. Summary Peripheral neuropathy is when the nerves in the arms or legs are damaged, resulting in numbness, weakness, or pain. There are many causes of peripheral neuropathy, including diabetes, pinched nerves, vitamin deficiencies, autoimmune disease, and hereditary conditions. Diagnosing and finding the cause of peripheral neuropathy can be difficult. Your health care provider will take your medical history, do a physical exam, and do tests, including blood tests and nerve function tests. Treatment involves treating the underlying cause of the neuropathy and taking medicines to help control pain. Physical therapy and assistive devices may also help. This information is not intended to replace advice given to you by your health care provider. Make sure you discuss any questions you have with your health care provider. Document Revised: 06/21/2021 Document Reviewed: 06/21/2021 Elsevier Patient Education  [place].

## 2022-10-18 NOTE — Progress Notes (Signed)
[facility]    Provider:  Dr Jaynee Eagles Referring Provider: Aura Dials, MD Primary Care Physician:  Aura Dials, MD   CC:  peripheral neuropathy  October 18, 2022 HPI: This is a patient we have seen in the past for headaches.  He is a 71 year old male with microcytic anemia, vitamin D deficiency, hyperlipidemia, abnormal liver functions, polymyalgia, chronic headaches, sciatica, allergies.  At that time MRI and MRA of the brain and head were unremarkable.  He is being sent back, we have not seen him since 2018, for neuropathy on gabapentin.  Per Dr. March Rummage notes he has discomfort in his feet, he notices this most after he has been sitting for period of time when he goes to bed, his feet feel cold, seems to involve his entire foot but mostly on the bottom, he has not noticed any weakness in his feet, he had nerve conduction studies done 06/26/2022 that revealed an early sensory polyneuropathy, there is no evidence of lumbar radiculopathy, plexopathy or mononeuropathy, he has in the past been checked for B12 and folate both of which were normal, his most recent hemoglobin A1c was 6.5.  Pain in the feet, started the end of 2022, slowly progressive, has been stable, continuous overall, worse at night, worse with sitting, some back hip pain but not all the time and not really related. The whole foot, tingloing across the top of the foot and bottom and putting pressure on the heels hurts, socks irritate him, no weakness, no radicular symptoms, he has had cramping in the legs and feet for years. No other focal neurologic deficits, associated symptoms, inciting events or modifiable factors. Here with wife who also provides much information.  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    I reviewed Dr. March Rummage notes, his folic acid was 79.1, his B12 was 1247, hemoglobin A1c has ranged between 6.3 and 6.5 since 2021 (could have been longer I only have records back to 2021), hepatic panel September 2023 was unremarkable.  It looks as though he was taking Tylenol for pain in his feet, they told him to take over-the-counter B12 I am unclear if he was B12 deficient, free T4 in August 2023 was normal as was TSH, CBC in August 2023 unremarkable some hypochromic microcytic anemia probably related to thalassemia trait, CMP in August 2023 showed BUN of 20 and creatinine of 1.09, AST was elevated at that time of 136 and greater than ALT of 50 which was newly abnormal per the notes.  MRI brain 12/2016:    IMPRESSION:  This is a normal age-appropriate MRI of the brain with and without contrast showing a couple small T2/FLAIR hyperintense foci in the subcortical white matter of the frontal lobes consistent with minimal age-appropriate chronic microvascular ischemic change. There are no acute findings.  MRA head 12/2016 normal  Interval history 02/20/2017:  71 year old male with new onset daily headaches after the age of 59. MRI of the brain and MRA of the head were unremarkable. He returns today to discuss. Esr and crp were unremarkable as well. He is feeling better. Reviewed MRI and MRA images with patient  explaining pertinent findings. His brain is very lovely in his blood vessels are widely patent. He exercises and watches his weight. He is compliant with medications. At this point we'll not prescribe medications for his headache. Did discuss headache triggers such as dehydration, certain foods to watch. Recommended keeping a headache journal.  His headaches have improved.     HPI:  Dustin Marshall is a 71 y.o. male here as a referral from Dr. Sheryn Bison for headache. Past medical history sciatica, allergic rhinitis, allergies. Mother died of a cerebral hemorrhage and sister was found recently to have 2 aneurysms in her brain. He has never smoked. Drinks alcohol 2+ per week. He is retired. Headache started in November. No inciting events or head trauma. He just noticed one day, nothing special and not intnse. At first they were stronger than now but they come 1-2x a day and 1-2x a night. They last 5-15 minutes. 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 At night it is worse when laying down. In the crown of the head and further back a little, sometime in the past had eye pain but recently more of an ache. Not burning or tingling. No light or sound sensitivity. No exposure to toxins. No changes in vision, he may need to have his glasses change. He has pulsatile tinnitus. A tylenol helps but not taking tylenol daily. No triggers. On average 3-4/10. His vision is worse today on exam that it was a month ago at the doctors office. No hx of headaches. Mo FHx of migraines. He snores but has lost 35 lbs. No morning or nocturnal headaches. No stress. He is retired and stress is better. Hi vision is 20/70 today which is worse than when he went to optometrist a month ago. No other focal neurologic deficits, associated symptoms, inciting events or modifiable factors.   Reviewed notes, labs and imaging from outside physicians, which showed:   Review primary care notes. Patient was last seen in early January. For daily headaches since November 2017. Headaches are mild to moderate in intensity and generally located over his frontal area on top of his head and described as persistent aching pain. He also began to develop pain behind his right eye. No change in his vision. He saw an eye doctor last year. No precipitating  causes for his headaches but they do tend to be worse at night. They do not appear to be positional. His had no pain in his neck or nasal congestion. He's had no nausea or vomiting. He does feel little bit dizzy on occasion when he goes from a lying to a sitting position. No trauma to his head. His mother died of a cerebral hemorrhage and his sister was found recently to have 2 aneurysms in her brain. Tongue and palate deviate to the right. Right frontal disc slightly indistinct compared to left.     Review of Systems: Patient complains of symptoms per HPI as well as the following symptoms: dizziness, allergies, runny nose. Pertinent negatives per HPI. All others negative.    .   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  Other Topics Concern   Not on file  Social History Narrative   Lives at home w/ his wife   Right-handed   Caffeine: 1 coffee per day   Social Determinants of Health   Financial Resource Strain: Not on file  Food Insecurity: Not on file  Transportation Needs: Not on file  Physical Activity: Not on file  Stress: Not on file  Social Connections: Not on file  Intimate Partner Violence: Not on file    Family History  Problem Relation Age of Onset   Aneurysm Mother    Cancer Father        lung ca   Thalassemia Sister    Aneurysm Sister    Cancer Brother        lung ca   Thalassemia Other    Neuropathy Neg Hx     Past Medical History:  Diagnosis Date   Allergy    GERD (gastroesophageal reflux disease)      Past Surgical History:  Procedure Laterality Date   COLONOSCOPY      Current Outpatient Medications  Medication Sig Dispense Refill   acetaminophen (TYLENOL) 325 MG tablet Take 650 mg by mouth every 6 (six) hours as needed.     Cyanocobalamin (B-12 PO) Take by mouth.     fluticasone (FLONASE) 50 MCG/ACT nasal spray Place 2 sprays into both nostrils daily as needed for allergies.     No current facility-administered medications for this visit.    Allergies as of 10/18/2022 - Review Complete 10/18/2022  Allergen Reaction Noted   Mobic [meloxicam] Palpitations 04/24/2015    Vitals: BP 136/74   Pulse (!) 48   Ht 6' 2" (1.88 m)   Wt 172 lb 9.6 oz (78.3 kg)   BMI 22.16 kg/m  Last Weight:  Wt Readings from Last 1 Encounters:  10/18/22 172 lb 9.6 oz (78.3 kg)   Last Height:   Ht Readings from Last 1 Encounters:  10/18/22 6' 2" (1.88 m)    Physical exam: Exam: Gen: NAD, conversant, well nourised, well groomed                     CV: RRR, no MRG. No Carotid Bruits. No peripheral edema, warm, nontender Eyes: Conjunctivae clear without exudates or hemorrhage  Neuro: Detailed Neurologic Exam  Speech:    Speech is normal; fluent and spontaneous with normal comprehension.  Cognition:    The patient is oriented to person, place, and time;     recent and remote memory intact;     language fluent;     normal attention, concentration,     fund of knowledge Cranial Nerves:    The pupils are equal, round, and reactive to light. attempted pupils too small to visualize fundi.  Visual fields are full to finger confrontation. Extraocular movements are intact. Trigeminal sensation is intact and the muscles of mastication are normal. The face is symmetric. The palate elevates in the midline. Hearing intact. Voice is normal. Shoulder shrug is normal. The tongue has normal motion without fasciculations.   Coordination:    Normal finger to nose and heel to shin.   Gait:    Heel-toe  and tandem gait are normal.   Motor Observation:    No asymmetry, no atrophy, and no involuntary  movements noted. Tone:    Normal muscle tone.    Posture:    Posture is normal. normal erect    Strength:    Strength is V/V in the upper and lower limbs.      Sensation: intact to vibration and proprioception, mild decrease pp and temp distally in the feet     Reflex Exam:  DTR's:    Deep tendon reflexes in the upper and lower extremities are normal bilaterally.   Toes:    The toes are downgoing bilaterally.   Clonus:    Clonus is absent.          Assessment/Plan:  Patient with mild sensory neuropathy, his most recent hemoglobin A1c was 6.5 and he has been prediabetic for at least several years (even pre-diabetes can cause peripheral neuropathy unfortunately).   Also there was a question of B12 deficiency was on the very low side of B12 so he has been taking supplements, if it was on the low side he probably was B12 deficient as well. I suspect pre-diabetes/diabetes and b12 deficiency as cause of his mild sensory neuropathy   - I recommend he manage his hemoglobin A1c last was 6.5, maybe start metformin(talk to Dr. Sheryn Bison) since I believe he is symptomatic and also discussed alpha-lipoic acid - Also sounds like he had a B12 deficiency, continue b12 supplementation - I reviewed emg/ncs. Absent surals can be often normally absent at this age so the emg/ncs was probably normal or as stated mild sensory neuropathy - although patient does have a small-fiber neuropathy based on clinical history and exam.    Cc: Dr. Rowe Robert, [facility] [address] [place], [place] [address]  Phone [phone] Fax [phone] I spent 60 minutes of face-to-face and non-face-to-face time with patient on the  1. 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 Other polyneuropathy    diagnosis.  This included previsit chart  review, lab review, study review, order entry, electronic health record documentation, patient education on the different diagnostic and therapeutic options, counseling and coordination of care, risks and benefits of management, compliance, or risk factor reduction

## 2022-10-24 ENCOUNTER — Encounter: Payer: Self-pay | Admitting: Neurology

## 2022-10-24 ENCOUNTER — Telehealth: Payer: Self-pay | Admitting: Neurology

## 2022-10-24 DIAGNOSIS — G608 Other hereditary and idiopathic neuropathies: Secondary | ICD-10-CM

## 2022-10-24 DIAGNOSIS — G6289 Other specified polyneuropathies: Secondary | ICD-10-CM

## 2022-10-24 NOTE — Telephone Encounter (Signed)
Tell him in one lab we found an increase in a normal component of blood. This may not mean anything but I want him to be evaluated by hematology for MGUS. Monoclonal gammopathy of undetermined significance (MGUS) is a condition in which an abnormal protein - known as monoclonal protein or M protein - is in your blood. The protein is produced in a type of white blood cell (plasma cells) in your bone marrow. MGUS usually causes no problems. But sometimes it can progress over years to other disorders, including some forms of blood cancer. It's important to have regular checkups to closely monitor monoclonal gammopathy so that if it does progress, you get earlier treatment. If there's no disease progression, MGUS doesn't require treatment.   After you talk to patient please place a referral to hematology for M-protein spike thanks. See phone note

## 2022-10-26 ENCOUNTER — Telehealth: Payer: Self-pay | Admitting: Neurology

## 2022-10-26 NOTE — Addendum Note (Signed)
Addended by: Darleen Crocker on: 10/26/2022 08:54 AM   Modules accepted: Orders

## 2022-10-26 NOTE — Telephone Encounter (Signed)
Pt was made aware of the results and is agreeable to referral to hematology

## 2022-10-26 NOTE — Telephone Encounter (Signed)
Referral for Hematology/Oncology fax to [facility]. Phone: [phone], Fax: [phone]

## 2022-10-29 LAB — MULTIPLE MYELOMA PANEL, SERUM
Albumin SerPl Elph-Mcnc: 3.8 g/dL (ref 2.9–4.4)
Albumin/Glob SerPl: 1.2 (ref 0.7–1.7)
Alpha 1: 0.2 g/dL (ref 0.0–0.4)
Alpha2 Glob SerPl Elph-Mcnc: 0.7 g/dL (ref 0.4–1.0)
B-Globulin SerPl Elph-Mcnc: 0.8 g/dL (ref 0.7–1.3)
Gamma Glob SerPl Elph-Mcnc: 1.7 g/dL (ref 0.4–1.8)
Globulin, Total: 3.4 g/dL (ref 2.2–3.9)
IgA/Immunoglobulin A, Serum: 157 mg/dL (ref 61–437)
IgG (Immunoglobin G), Serum: 945 mg/dL (ref 603–1613)
IgM (Immunoglobulin M), Srm: 1247 mg/dL — ABNORMAL HIGH (ref 15–143)
M Protein SerPl Elph-Mcnc: 0.5 g/dL — ABNORMAL HIGH
Total Protein: 7.2 g/dL (ref 6.0–8.5)

## 2022-10-29 LAB — VITAMIN B6: Vitamin B6: 24.8 ug/L (ref 3.4–65.2)

## 2022-10-29 LAB — VITAMIN B1: Thiamine: 75.2 nmol/L (ref 66.5–200.0)

## 2022-11-08 NOTE — Telephone Encounter (Signed)
The patient is scheduled for 1/22 with Dr. Lynnae Prude

## 2022-11-10 DIAGNOSIS — F410 Panic disorder [episodic paroxysmal anxiety] without agoraphobia: Secondary | ICD-10-CM | POA: Diagnosis not present

## 2022-11-10 DIAGNOSIS — F3289 Other specified depressive episodes: Secondary | ICD-10-CM | POA: Diagnosis not present

## 2022-11-10 DIAGNOSIS — F4323 Adjustment disorder with mixed anxiety and depressed mood: Secondary | ICD-10-CM | POA: Diagnosis not present

## 2022-11-20 DIAGNOSIS — D472 Monoclonal gammopathy: Secondary | ICD-10-CM | POA: Diagnosis not present

## 2022-11-20 DIAGNOSIS — R7309 Other abnormal glucose: Secondary | ICD-10-CM | POA: Diagnosis not present

## 2022-11-20 DIAGNOSIS — D509 Iron deficiency anemia, unspecified: Secondary | ICD-10-CM | POA: Diagnosis not present

## 2022-11-21 ENCOUNTER — Other Ambulatory Visit (HOSPITAL_COMMUNITY): Payer: Self-pay | Admitting: Physician Assistant

## 2022-11-21 DIAGNOSIS — D472 Monoclonal gammopathy: Secondary | ICD-10-CM

## 2022-11-24 DIAGNOSIS — F4323 Adjustment disorder with mixed anxiety and depressed mood: Secondary | ICD-10-CM | POA: Diagnosis not present

## 2022-11-24 DIAGNOSIS — F3289 Other specified depressive episodes: Secondary | ICD-10-CM | POA: Diagnosis not present

## 2022-11-24 DIAGNOSIS — F410 Panic disorder [episodic paroxysmal anxiety] without agoraphobia: Secondary | ICD-10-CM | POA: Diagnosis not present

## 2022-11-28 ENCOUNTER — Ambulatory Visit (HOSPITAL_COMMUNITY)
Admission: RE | Admit: 2022-11-28 | Discharge: 2022-11-28 | Disposition: A | Discharge status: Home / Self Care | Payer: Medicare PPO | Source: Ambulatory Visit | Attending: Physician Assistant | Admitting: Physician Assistant

## 2022-11-28 DIAGNOSIS — D472 Monoclonal gammopathy: Secondary | ICD-10-CM | POA: Insufficient documentation

## 2022-11-28 DIAGNOSIS — Z0389 Encounter for observation for other suspected diseases and conditions ruled out: Secondary | ICD-10-CM | POA: Diagnosis not present

## 2022-12-04 DIAGNOSIS — D509 Iron deficiency anemia, unspecified: Secondary | ICD-10-CM | POA: Diagnosis not present

## 2022-12-11 DIAGNOSIS — D472 Monoclonal gammopathy: Secondary | ICD-10-CM | POA: Diagnosis not present

## 2022-12-25 DIAGNOSIS — F4323 Adjustment disorder with mixed anxiety and depressed mood: Secondary | ICD-10-CM | POA: Diagnosis not present

## 2022-12-25 DIAGNOSIS — F3289 Other specified depressive episodes: Secondary | ICD-10-CM | POA: Diagnosis not present

## 2022-12-25 DIAGNOSIS — F410 Panic disorder [episodic paroxysmal anxiety] without agoraphobia: Secondary | ICD-10-CM | POA: Diagnosis not present

## 2022-12-28 DIAGNOSIS — D485 Neoplasm of uncertain behavior of skin: Secondary | ICD-10-CM | POA: Diagnosis not present

## 2022-12-28 DIAGNOSIS — L821 Other seborrheic keratosis: Secondary | ICD-10-CM | POA: Diagnosis not present

## 2022-12-28 DIAGNOSIS — D225 Melanocytic nevi of trunk: Secondary | ICD-10-CM | POA: Diagnosis not present

## 2023-01-17 DIAGNOSIS — K648 Other hemorrhoids: Secondary | ICD-10-CM | POA: Diagnosis not present

## 2023-01-17 DIAGNOSIS — K644 Residual hemorrhoidal skin tags: Secondary | ICD-10-CM | POA: Diagnosis not present

## 2023-01-17 DIAGNOSIS — K589 Irritable bowel syndrome without diarrhea: Secondary | ICD-10-CM | POA: Diagnosis not present

## 2023-01-17 DIAGNOSIS — D120 Benign neoplasm of cecum: Secondary | ICD-10-CM | POA: Diagnosis not present

## 2023-01-17 DIAGNOSIS — Z1211 Encounter for screening for malignant neoplasm of colon: Secondary | ICD-10-CM | POA: Diagnosis not present

## 2023-02-05 DIAGNOSIS — F3289 Other specified depressive episodes: Secondary | ICD-10-CM | POA: Diagnosis not present

## 2023-02-05 DIAGNOSIS — Z79899 Other long term (current) drug therapy: Secondary | ICD-10-CM | POA: Diagnosis not present

## 2023-02-05 DIAGNOSIS — F4323 Adjustment disorder with mixed anxiety and depressed mood: Secondary | ICD-10-CM | POA: Diagnosis not present

## 2023-02-05 DIAGNOSIS — F410 Panic disorder [episodic paroxysmal anxiety] without agoraphobia: Secondary | ICD-10-CM | POA: Diagnosis not present

## 2023-02-14 DIAGNOSIS — D472 Monoclonal gammopathy: Secondary | ICD-10-CM | POA: Diagnosis not present

## 2023-02-19 DIAGNOSIS — D509 Iron deficiency anemia, unspecified: Secondary | ICD-10-CM | POA: Diagnosis not present

## 2023-02-19 DIAGNOSIS — D472 Monoclonal gammopathy: Secondary | ICD-10-CM | POA: Diagnosis not present

## 2023-03-07 DIAGNOSIS — F410 Panic disorder [episodic paroxysmal anxiety] without agoraphobia: Secondary | ICD-10-CM | POA: Diagnosis not present

## 2023-03-07 DIAGNOSIS — Z79899 Other long term (current) drug therapy: Secondary | ICD-10-CM | POA: Diagnosis not present

## 2023-03-07 DIAGNOSIS — F3289 Other specified depressive episodes: Secondary | ICD-10-CM | POA: Diagnosis not present

## 2023-03-07 DIAGNOSIS — F4323 Adjustment disorder with mixed anxiety and depressed mood: Secondary | ICD-10-CM | POA: Diagnosis not present

## 2023-04-16 DIAGNOSIS — D472 Monoclonal gammopathy: Secondary | ICD-10-CM | POA: Diagnosis not present

## 2023-05-22 DIAGNOSIS — D509 Iron deficiency anemia, unspecified: Secondary | ICD-10-CM | POA: Diagnosis not present

## 2023-05-22 DIAGNOSIS — D472 Monoclonal gammopathy: Secondary | ICD-10-CM | POA: Diagnosis not present

## 2023-05-31 DIAGNOSIS — F4323 Adjustment disorder with mixed anxiety and depressed mood: Secondary | ICD-10-CM | POA: Diagnosis not present

## 2023-05-31 DIAGNOSIS — F410 Panic disorder [episodic paroxysmal anxiety] without agoraphobia: Secondary | ICD-10-CM | POA: Diagnosis not present

## 2023-08-03 DIAGNOSIS — Z0000 Encounter for general adult medical examination without abnormal findings: Secondary | ICD-10-CM | POA: Diagnosis not present

## 2023-08-03 DIAGNOSIS — D649 Anemia, unspecified: Secondary | ICD-10-CM | POA: Diagnosis not present

## 2023-08-03 DIAGNOSIS — E559 Vitamin D deficiency, unspecified: Secondary | ICD-10-CM | POA: Diagnosis not present

## 2023-08-03 DIAGNOSIS — R972 Elevated prostate specific antigen [PSA]: Secondary | ICD-10-CM | POA: Diagnosis not present

## 2023-08-03 DIAGNOSIS — G629 Polyneuropathy, unspecified: Secondary | ICD-10-CM | POA: Diagnosis not present

## 2023-08-03 DIAGNOSIS — N401 Enlarged prostate with lower urinary tract symptoms: Secondary | ICD-10-CM | POA: Diagnosis not present

## 2023-08-03 DIAGNOSIS — E538 Deficiency of other specified B group vitamins: Secondary | ICD-10-CM | POA: Diagnosis not present

## 2023-08-03 DIAGNOSIS — E782 Mixed hyperlipidemia: Secondary | ICD-10-CM | POA: Diagnosis not present

## 2023-08-03 DIAGNOSIS — R739 Hyperglycemia, unspecified: Secondary | ICD-10-CM | POA: Diagnosis not present

## 2023-08-22 DIAGNOSIS — D472 Monoclonal gammopathy: Secondary | ICD-10-CM | POA: Diagnosis not present

## 2023-11-21 DIAGNOSIS — D509 Iron deficiency anemia, unspecified: Secondary | ICD-10-CM | POA: Diagnosis not present

## 2023-11-21 DIAGNOSIS — D472 Monoclonal gammopathy: Secondary | ICD-10-CM | POA: Diagnosis not present

## 2023-12-03 DIAGNOSIS — Z79899 Other long term (current) drug therapy: Secondary | ICD-10-CM | POA: Diagnosis not present

## 2023-12-03 DIAGNOSIS — F4323 Adjustment disorder with mixed anxiety and depressed mood: Secondary | ICD-10-CM | POA: Diagnosis not present

## 2023-12-03 DIAGNOSIS — F410 Panic disorder [episodic paroxysmal anxiety] without agoraphobia: Secondary | ICD-10-CM | POA: Diagnosis not present

## 2023-12-03 DIAGNOSIS — Z5181 Encounter for therapeutic drug level monitoring: Secondary | ICD-10-CM | POA: Diagnosis not present

## 2024-03-07 DIAGNOSIS — S90561A Insect bite (nonvenomous), right ankle, initial encounter: Secondary | ICD-10-CM | POA: Diagnosis not present

## 2024-03-07 DIAGNOSIS — W57XXXA Bitten or stung by nonvenomous insect and other nonvenomous arthropods, initial encounter: Secondary | ICD-10-CM | POA: Diagnosis not present

## 2024-03-07 DIAGNOSIS — Z6822 Body mass index (BMI) 22.0-22.9, adult: Secondary | ICD-10-CM | POA: Diagnosis not present

## 2024-05-27 DIAGNOSIS — R799 Abnormal finding of blood chemistry, unspecified: Secondary | ICD-10-CM | POA: Diagnosis not present

## 2024-05-27 DIAGNOSIS — G608 Other hereditary and idiopathic neuropathies: Secondary | ICD-10-CM | POA: Diagnosis not present

## 2024-05-27 DIAGNOSIS — D472 Monoclonal gammopathy: Secondary | ICD-10-CM | POA: Diagnosis not present

## 2024-06-02 DIAGNOSIS — F410 Panic disorder [episodic paroxysmal anxiety] without agoraphobia: Secondary | ICD-10-CM | POA: Diagnosis not present

## 2024-06-02 DIAGNOSIS — F3289 Other specified depressive episodes: Secondary | ICD-10-CM | POA: Diagnosis not present

## 2024-06-02 DIAGNOSIS — F4323 Adjustment disorder with mixed anxiety and depressed mood: Secondary | ICD-10-CM | POA: Diagnosis not present

## 2024-08-27 ENCOUNTER — Ambulatory Visit (HOSPITAL_BASED_OUTPATIENT_CLINIC_OR_DEPARTMENT_OTHER): Admitting: Family Medicine

## 2024-08-27 ENCOUNTER — Encounter (HOSPITAL_BASED_OUTPATIENT_CLINIC_OR_DEPARTMENT_OTHER): Payer: Self-pay | Admitting: Family Medicine

## 2024-08-27 VITALS — BP 129/81 | HR 46 | Temp 97.7°F | Resp 18 | Ht 73.0 in | Wt 170.0 lb

## 2024-08-27 DIAGNOSIS — E559 Vitamin D deficiency, unspecified: Secondary | ICD-10-CM

## 2024-08-27 DIAGNOSIS — R7303 Prediabetes: Secondary | ICD-10-CM | POA: Insufficient documentation

## 2024-08-27 DIAGNOSIS — Z125 Encounter for screening for malignant neoplasm of prostate: Secondary | ICD-10-CM

## 2024-08-27 DIAGNOSIS — Z0000 Encounter for general adult medical examination without abnormal findings: Secondary | ICD-10-CM

## 2024-08-27 DIAGNOSIS — D509 Iron deficiency anemia, unspecified: Secondary | ICD-10-CM

## 2024-08-27 NOTE — Assessment & Plan Note (Signed)
 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  Notes that he will often have microcytic anemia noted on labs, this generally has been stable recently

## 2024-08-27 NOTE — Progress Notes (Signed)
 New Patient Office Visit  Subjective   Patient ID: Dustin Marshall, male    DOB: 1951/03/31  Age: 73 y.o. MRN: [id number]  CC:  Chief Complaint  Patient presents with   Annual Exam    HPI Dustin Marshall presents to establish care Last PCP - Dr. Frederik at [facility]  Prediabetes: taking metformin 500 mg daily. No side effects reported.  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 Monitoring with routine labs, no treatment in the past.   Will take alprazolam as needed, more so to help with sleep. Takes this about 4 nights per week. Has been taking this for about 1 year. No other medications used for sleep in the past. This has been managed by psychiatry - May Coleman.  Does take otc vitamin D supplement.  Dustin Marshall is originally from [place]. He enjoys painting, works out at [employer], gardening.  Outpatient Encounter Medications as of 08/27/2024  Medication Sig   acetaminophen (TYLENOL) 325 MG tablet Take 650 mg by mouth every 6 (six) hours as needed.   ALPRAZolam (XANAX) 0.25 MG tablet Take 0.25 mg by mouth 2 (two) times daily as needed.   Cholecalciferol 50 MCG (2000 UT) TABS Take 2,000 Units by mouth.   fluticasone (FLONASE) 50 MCG/ACT nasal spray Place 2 sprays into both nostrils daily as needed for allergies.   metFORMIN (GLUCOPHAGE) 500 MG tablet Take 500 mg by mouth daily.   [DISCONTINUED] Cyanocobalamin  (B-12 PO) Take by mouth.   No facility-administered encounter medications on file as of 08/27/2024.    Past Medical History:  Diagnosis Date   Allergy    Anemia since childhood   Thalasemia trait   Anxiety 2023   short term   Diabetes mellitus without complication (HCC) 2023   prediabetic range   GERD (gastroesophageal reflux disease)    Neuromuscular disorder (HCC) 2022   peripheral neuropathy   Ulcer 1970- 1995   no longer a problem    Past Surgical History:  Procedure Laterality Date    COLONOSCOPY      Family History  Problem Relation Age of Onset   Aneurysm Mother    Cancer Father        lung ca   Thalassemia Sister    Aneurysm Sister    Cancer Sister    Diabetes Sister    Cancer Brother        lung ca   Thalassemia Other    Diabetes Brother    Neuropathy Neg Hx     Social History   Socioeconomic History   Marital status: Married    Spouse name: Not on file   Number of children: 3   Years of education: Not on file   Highest education level: Master's degree (e.g., MA, MS, MEng, MEd, MSW, MBA)  Occupational History   Occupation: Retired  Tobacco Use   Smoking status: Never   Smokeless tobacco: Never  Substance and Sexual Activity   Alcohol use: Yes    Alcohol/week: 1.0 standard drink of alcohol    Types: 1 Glasses of wine per week    Comment: usually none   Drug use: No   Sexual activity: Yes    Comment: spouse  Other Topics Concern   Not on file  Social History Narrative   Lives at home w/ his wife   Right-handed   Caffeine: 1 coffee per day   Social Drivers of Health  Financial Resource Strain: Low Risk  (08/26/2024)   Overall Financial Resource Strain (CARDIA)    Difficulty of Paying Living Expenses: Not hard at all  Food Insecurity: No Food Insecurity (08/26/2024)   Hunger Vital Sign    Worried About Running Out of Food in the Last Year: Never true    Ran Out of Food in the Last Year: Never true  Transportation Needs: No Transportation Needs (08/26/2024)   PRAPARE - Administrator, Civil Service (Medical): No    Lack of Transportation (Non-Medical): No  Physical Activity: Sufficiently Active (08/26/2024)   Exercise Vital Sign    Days of Exercise per Week: 3 days    Minutes of Exercise per Session: 140 min  Stress: No Stress Concern Present (08/26/2024)   [employer] of Occupational Health - Occupational Stress Questionnaire    Feeling of Stress: Not at all  Social Connections: Socially Integrated (08/26/2024)    Social Connection and Isolation Panel    Frequency of Communication with Friends and Family: Three times a week    Frequency of Social Gatherings with Friends and Family: Twice a week    Attends Religious Services: More than 4 times per year    Active Member of [employer] or Organizations: Yes    Attends Banker Meetings: Patient declined    Marital Status: Married  Catering Manager Violence: Not At Risk (08/27/2024)   Humiliation, Afraid, Rape, and Kick questionnaire    Fear of Current or Ex-Partner: No    Emotionally Abused: No    Physically Abused: No    Sexually Abused: No   Objective   BP 129/81   Pulse (!) 46   Temp 97.7 F (36.5 C)   Resp 18   Ht 6' 1 (1.854 m)   Wt 170 lb (77.1 kg)   SpO2 100%   BMI 22.43 kg/m   Physical Exam  73 year old male in no acute distress Cardiovascular exam with regular rate and rhythm Lungs clear to auscultation bilaterally  Assessment & Plan:   Prediabetes Assessment & Plan: Diagnosed in the past, reports to be started on metformin by prior provider.  Denies any issues with medication currently.  Can continue with metformin at this time.  We will check A1c with labs for physical for monitoring   Wellness examination -     CBC with Differential/Platelet; Future -     Comprehensive metabolic panel with GFR; Future -     Hemoglobin A1c; Future -     Lipid panel; Future -     PSA Total (Reflex To Free); Future -     VITAMIN D 25 Hydroxy (Vit-D Deficiency, Fractures); Future  Vitamin D deficiency Assessment & Plan: Vitamin D deficiency noted in the past.  He does take current vitamin D supplement.  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  Notes that he  will often have microcytic anemia noted on labs, this generally has been stable recently   Return in about 6 weeks (around 10/08/2024) for CPE with fasting labs 1 week prior.    ___________________________________________ Dustin Schmuck [place], MD, ABFM, CAQSM Primary Care and Sports [facility]

## 2024-08-27 NOTE — Assessment & Plan Note (Signed)
 Vitamin D deficiency noted in the past.  He does take current vitamin D supplement.  We can plan to check vitamin D level with labs for physical

## 2024-08-27 NOTE — Assessment & Plan Note (Signed)
 Diagnosed in the past, reports to be started on metformin by prior provider.  Denies any issues with medication currently.  Can continue with metformin at this time.  We will check A1c with labs for physical for monitoring

## 2024-09-16 DIAGNOSIS — D485 Neoplasm of uncertain behavior of skin: Secondary | ICD-10-CM | POA: Diagnosis not present

## 2024-10-02 ENCOUNTER — Other Ambulatory Visit (HOSPITAL_BASED_OUTPATIENT_CLINIC_OR_DEPARTMENT_OTHER): Payer: Self-pay | Admitting: Family Medicine

## 2024-10-02 DIAGNOSIS — E559 Vitamin D deficiency, unspecified: Secondary | ICD-10-CM | POA: Diagnosis not present

## 2024-10-02 DIAGNOSIS — Z125 Encounter for screening for malignant neoplasm of prostate: Secondary | ICD-10-CM | POA: Diagnosis not present

## 2024-10-02 DIAGNOSIS — Z0000 Encounter for general adult medical examination without abnormal findings: Secondary | ICD-10-CM

## 2024-10-03 LAB — CBC WITH DIFFERENTIAL/PLATELET
Basophils Absolute: 0.1 x10E3/uL (ref 0.0–0.2)
Basos: 1 %
EOS (ABSOLUTE): 0.5 x10E3/uL — ABNORMAL HIGH (ref 0.0–0.4)
Eos: 8 %
Hematocrit: 41.8 % (ref 37.5–51.0)
Hemoglobin: 13.1 g/dL (ref 13.0–17.7)
Immature Grans (Abs): 0 x10E3/uL (ref 0.0–0.1)
Immature Granulocytes: 0 %
Lymphocytes Absolute: 2 x10E3/uL (ref 0.7–3.1)
Lymphs: 32 %
MCH: 24.7 pg — ABNORMAL LOW (ref 26.6–33.0)
MCHC: 31.3 g/dL — ABNORMAL LOW (ref 31.5–35.7)
MCV: 79 fL (ref 79–97)
Monocytes Absolute: 0.4 x10E3/uL (ref 0.1–0.9)
Monocytes: 7 %
Neutrophils Absolute: 3.2 x10E3/uL (ref 1.4–7.0)
Neutrophils: 52 %
Platelets: 277 x10E3/uL (ref 150–450)
RBC: 5.31 x10E6/uL (ref 4.14–5.80)
RDW: 15.8 % — ABNORMAL HIGH (ref 11.6–15.4)
WBC: 6.2 x10E3/uL (ref 3.4–10.8)

## 2024-10-03 LAB — COMPREHENSIVE METABOLIC PANEL WITH GFR
ALT: 19 IU/L (ref 0–44)
AST: 32 IU/L (ref 0–40)
Albumin: 4.3 g/dL (ref 3.8–4.8)
Alkaline Phosphatase: 58 IU/L (ref 47–123)
BUN/Creatinine Ratio: 19 (ref 10–24)
BUN: 22 mg/dL (ref 8–27)
Bilirubin Total: 0.8 mg/dL (ref 0.0–1.2)
CO2: 21 mmol/L (ref 20–29)
Calcium: 9.5 mg/dL (ref 8.6–10.2)
Chloride: 104 mmol/L (ref 96–106)
Creatinine, Ser: 1.18 mg/dL (ref 0.76–1.27)
Globulin, Total: 2.8 g/dL (ref 1.5–4.5)
Glucose: 96 mg/dL (ref 70–99)
Potassium: 5.1 mmol/L (ref 3.5–5.2)
Sodium: 139 mmol/L (ref 134–144)
Total Protein: 7.1 g/dL (ref 6.0–8.5)
eGFR: 64 mL/min/1.73 (ref >59)

## 2024-10-03 LAB — LIPID PANEL
Chol/HDL Ratio: 2.8 ratio (ref 0.0–5.0)
Cholesterol, Total: 261 mg/dL — ABNORMAL HIGH (ref 100–199)
HDL: 92 mg/dL (ref >39)
LDL Chol Calc (NIH): 161 mg/dL — ABNORMAL HIGH (ref 0–99)
Triglycerides: 50 mg/dL (ref 0–149)
VLDL Cholesterol Cal: 8 mg/dL (ref 5–40)

## 2024-10-03 LAB — PSA TOTAL (REFLEX TO FREE): Prostate Specific Ag, Serum: 2.8 ng/mL (ref 0.0–4.0)

## 2024-10-03 LAB — VITAMIN D 25 HYDROXY (VIT D DEFICIENCY, FRACTURES): Vit D, 25-Hydroxy: 39 ng/mL (ref 30.0–100.0)

## 2024-10-03 LAB — HEMOGLOBIN A1C
Est. average glucose Bld gHb Est-mCnc: 126 mg/dL
Hgb A1c MFr Bld: 6 % — ABNORMAL HIGH (ref 4.8–5.6)

## 2024-10-08 ENCOUNTER — Ambulatory Visit (HOSPITAL_BASED_OUTPATIENT_CLINIC_OR_DEPARTMENT_OTHER): Payer: Self-pay | Admitting: Family Medicine

## 2024-10-15 ENCOUNTER — Encounter (HOSPITAL_BASED_OUTPATIENT_CLINIC_OR_DEPARTMENT_OTHER): Admitting: Family Medicine

## 2024-10-15 ENCOUNTER — Encounter (HOSPITAL_BASED_OUTPATIENT_CLINIC_OR_DEPARTMENT_OTHER): Payer: Self-pay | Admitting: Family Medicine

## 2024-10-15 VITALS — BP 124/75 | HR 45 | Temp 97.6°F | Resp 18 | Ht 73.0 in | Wt 174.0 lb

## 2024-10-15 DIAGNOSIS — E785 Hyperlipidemia, unspecified: Secondary | ICD-10-CM | POA: Diagnosis not present

## 2024-10-15 DIAGNOSIS — Z0000 Encounter for general adult medical examination without abnormal findings: Secondary | ICD-10-CM | POA: Insufficient documentation

## 2024-10-15 DIAGNOSIS — R7303 Prediabetes: Secondary | ICD-10-CM | POA: Diagnosis not present

## 2024-10-15 MED ORDER — METFORMIN HCL 500 MG PO TABS: 500.0000 mg | ORAL_TABLET | Freq: Every day | ORAL | 1 refills | Status: AC

## 2024-10-15 NOTE — Assessment & Plan Note (Signed)
 Recent cholesterol panel with elevated total cholesterol and LDL.  We discussed ASCVD rescore which is 11.8%.  Discussed the meaning of this.  We discussed consideration for additional evaluation such as with CAC scoring.  After discussion, patient elected to continue with lifestyle modifications with plan to do recheck cholesterol panel in a few months to assess progress with more dedicated focus on lifestyle modifications.  Feel that this would be reasonable.  He will plan to complete labs before his next appointment with me in about 4 to 6 months.

## 2024-10-15 NOTE — Progress Notes (Signed)
 Subjective:    CC: Annual Physical Exam  HPI: Dustin Marshall is a 73 y.o. presenting for annual physical  I reviewed the past medical history, family history, social history, surgical history, and allergies today and no changes were needed.  Please see the problem list section below in epic for further details.  Past Medical History: Past Medical History:  Diagnosis Date   Allergy    Anemia since childhood   Thalasemia trait   Anxiety 2023   short term   Diabetes mellitus without complication (HCC) 2023   prediabetic range   GERD (gastroesophageal reflux disease)    Hyperlipidemia    Neuromuscular disorder (HCC) 2022   peripheral neuropathy   Ulcer 1970- 1995   no longer a problem   Past Surgical History: Past Surgical History:  Procedure Laterality Date   COLONOSCOPY     Social History: Social History   Socioeconomic History   Marital status: Married    Spouse name: Not on file   Number of children: 3   Years of education: Not on file   Highest education level: Master's degree (e.g., MA, MS, MEng, MEd, MSW, MBA)  Occupational History   Occupation: Retired  Tobacco Use   Smoking status: Never   Smokeless tobacco: Never  Substance and Sexual Activity   Alcohol use: Yes    Alcohol/week: 1.0 standard drink of alcohol    Types: 1 Glasses of wine per week    Comment: 1 glass but usually none   Drug use: Never   Sexual activity: Yes    Birth control/protection: None    Comment: spouse  Other Topics Concern   Not on file  Social History Narrative   Lives at home w/ his wife   Right-handed   Caffeine: 1 coffee per day   Social Drivers of Health   Tobacco Use: Low Risk (10/15/2024)   Patient History    Smoking Tobacco Use: Never    Smokeless Tobacco Use: Never    Passive Exposure: Not on file  Financial Resource Strain: Low Risk (08/26/2024)   Overall Financial Resource Strain (CARDIA)    Difficulty of Paying Living Expenses: Not hard at all  Food  Insecurity: No Food Insecurity (08/26/2024)   Epic    Worried About Radiation Protection Practitioner of Food in the Last Year: Never true    Ran Out of Food in the Last Year: Never true  Transportation Needs: No Transportation Needs (08/26/2024)   Epic    Lack of Transportation (Medical): No    Lack of Transportation (Non-Medical): No  Physical Activity: Sufficiently Active (08/26/2024)   Exercise Vital Sign    Days of Exercise per Week: 3 days    Minutes of Exercise per Session: 140 min  Stress: No Stress Concern Present (08/26/2024)   [employer] of Occupational Health - Occupational Stress Questionnaire    Feeling of Stress: Not at all  Social Connections: Socially Integrated (08/26/2024)   Social Connection and Isolation Panel    Frequency of Communication with Friends and Family: Three times a week    Frequency of Social Gatherings with Friends and Family: Twice a week    Attends Religious Services: More than 4 times per year    Active Member of Clubs or Organizations: Yes    Attends Banker Meetings: Patient declined    Marital Status: Married  Depression (PHQ2-9): Low Risk (08/27/2024)   Depression (PHQ2-9)    PHQ-2 Score: 0  Alcohol Screen: Low Risk (08/26/2024)   Alcohol  Screen    Last Alcohol Screening Score (AUDIT): 1  Housing: Low Risk (08/26/2024)   Epic    Unable to Pay for Housing in the Last Year: No    Number of Times Moved in the Last Year: 0    Homeless in the Last Year: No  Utilities: Not At Risk (08/27/2024)   Epic    Threatened with loss of utilities: No  Health Literacy: Adequate Health Literacy (08/27/2024)   B1300 Health Literacy    Frequency of need for help with medical instructions: Never   Family History: Family History  Problem Relation Age of Onset   Aneurysm Mother    Cancer Father        lung ca   Thalassemia Sister    Aneurysm Sister    Cancer Sister    Diabetes Sister    Cancer Brother        lung ca   Thalassemia Other     Diabetes Brother    Neuropathy Neg Hx    Allergies: Allergies[1] Medications: See med rec.  Review of Systems: No headache, visual changes, nausea, vomiting, diarrhea, constipation, dizziness, abdominal pain, skin rash, fevers, chills, night sweats, swollen lymph nodes, weight loss, chest pain, body aches, joint swelling, muscle aches, shortness of breath, mood changes, visual or auditory hallucinations.  Objective:    BP 124/75 (BP Location: Left Arm, Patient Position: Sitting, Cuff Size: Normal)   Pulse (!) 45   Temp 97.6 F (36.4 C) (Oral)   Resp 18   Ht 6' 1 (1.854 m)   Wt 174 lb (78.9 kg)   SpO2 100%   BMI 22.96 kg/m   General: Well Developed, well nourished, and in no acute distress. Neuro: Alert and oriented x3, extra-ocular muscles intact, sensation grossly intact. Cranial nerves II through XII are intact, motor, sensory, and coordinative functions are all intact. HEENT: Normocephalic, atraumatic, pupils equal round reactive to light, neck supple, no masses, no lymphadenopathy, thyroid nonpalpable. Oropharynx, nasopharynx, external ear canals are unremarkable. Skin: Warm and dry, no rashes noted. Cardiac: Regular rate and rhythm, no murmurs rubs or gallops. Respiratory: Clear to auscultation bilaterally. Not using accessory muscles, speaking in full sentences. Abdominal: Soft, nontender, nondistended, positive bowel sounds, no masses, no organomegaly. Musculoskeletal: Shoulder, elbow, wrist, hip, knee, ankle stable, and with full range of motion.  Impression and Recommendations:    Wellness examination Assessment & Plan: Routine HCM labs reviewed. HCM reviewed/discussed. Anticipatory guidance regarding healthy weight, lifestyle and choices given. Recommend healthy diet.  Recommend approximately 150 minutes/week of moderate intensity exercise Recommend regular dental and vision exams Always use seatbelt/lap and shoulder restraints Recommend using smoke alarms and  checking batteries at least twice a year Recommend using sunscreen when outside Discussed colon cancer screening recommendations, options.  Patient UTD Discussed recommendations for shingles vaccine.  Patient reports completing previously Discussed immunization recommendations The natural history of prostate cancer and ongoing controversy regarding screening and potential treatment outcomes of prostate cancer has been discussed with the patient. The meaning of a false positive PSA and a false negative PSA has been discussed. He indicates understanding of the limitations of this screening test and wished to proceed with screening PSA testing - this was normal   Hyperlipidemia, unspecified hyperlipidemia type Assessment & Plan: Recent cholesterol panel with elevated total cholesterol and LDL.  We discussed ASCVD rescore which is 11.8%.  Discussed the meaning of this.  We discussed consideration for additional evaluation such as with CAC scoring.  After discussion, patient elected  to continue with lifestyle modifications with plan to do recheck cholesterol panel in a few months to assess progress with more dedicated focus on lifestyle modifications.  Feel that this would be reasonable.  He will plan to complete labs before his next appointment with me in about 4 to 6 months.  Orders: -     Lipid panel; Future  Prediabetes -     Hemoglobin A1c; Future  Other orders -     metFORMIN  HCl; Take 1 tablet (500 mg total) by mouth daily.  Dispense: 90 tablet; Refill: 1  Return in about 4 months (around 02/13/2025) for prediabetes, hyperlipidemia.   ___________________________________________ Jafar Poffenberger de [place], MD, ABFM, [facility]    [1]  Allergies Allergen Reactions   Mobic [Meloxicam] Palpitations

## 2024-10-15 NOTE — Assessment & Plan Note (Addendum)
 Routine HCM labs reviewed. HCM reviewed/discussed. Anticipatory guidance regarding healthy weight, lifestyle and choices given. Recommend healthy diet.  Recommend approximately 150 minutes/week of moderate intensity exercise Recommend regular dental and vision exams Always use seatbelt/lap and shoulder restraints Recommend using smoke alarms and checking batteries at least twice a year Recommend using sunscreen when outside Discussed colon cancer screening recommendations, options.  Patient UTD Discussed recommendations for shingles vaccine.  Patient reports completing previously Discussed immunization recommendations The natural history of prostate cancer and ongoing controversy regarding screening and potential treatment outcomes of prostate cancer has been discussed with the patient. The meaning of a false positive PSA and a false negative PSA has been discussed. He indicates understanding of the limitations of this screening test and wished to proceed with screening PSA testing - this was normal

## 2024-11-06 ENCOUNTER — Telehealth (HOSPITAL_BASED_OUTPATIENT_CLINIC_OR_DEPARTMENT_OTHER): Payer: Self-pay | Admitting: Family Medicine

## 2024-11-06 NOTE — Telephone Encounter (Signed)
 Copied from CRM [id number]. Topic: Medicare AWV >> Nov 06, 2024 11:31 AM Nathanel DEL wrote: Called LVM 11/06/2024 to sched AWVS. Please schedule AWVS in office.  Nathanel Paschal; Care Guide Ambulatory Clinical Support [place] [facility] Direct Dial: [phone]

## 2025-02-19 ENCOUNTER — Ambulatory Visit (HOSPITAL_BASED_OUTPATIENT_CLINIC_OR_DEPARTMENT_OTHER): Admitting: Family Medicine
